# Patient Record
Sex: Female | Born: 2005 | Race: Black or African American | Hispanic: No | Marital: Single | Smoking: Never smoker
Health system: Southern US, Community
[De-identification: ages and names within clinical notes are randomized; demographics above are authoritative.]

## PROBLEM LIST (undated history)

## (undated) ENCOUNTER — Inpatient Hospital Stay (HOSPITAL_COMMUNITY): Payer: Self-pay

## (undated) DIAGNOSIS — Z789 Other specified health status: Secondary | ICD-10-CM

## (undated) HISTORY — DX: Other specified health status: Z78.9

## (undated) HISTORY — PX: NO PAST SURGERIES: SHX2092

---
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
## 2024-01-11 ENCOUNTER — Encounter: Admitting: Obstetrics and Gynecology

## 2024-01-18 ENCOUNTER — Telehealth (HOSPITAL_COMMUNITY): Payer: Self-pay | Admitting: *Deleted

## 2024-01-18 ENCOUNTER — Ambulatory Visit: Attending: Obstetrics and Gynecology

## 2024-01-18 ENCOUNTER — Other Ambulatory Visit: Payer: Self-pay | Admitting: *Deleted

## 2024-01-18 ENCOUNTER — Encounter: Payer: Self-pay | Admitting: Obstetrics and Gynecology

## 2024-01-18 ENCOUNTER — Other Ambulatory Visit: Payer: Self-pay | Admitting: Obstetrics and Gynecology

## 2024-01-18 ENCOUNTER — Other Ambulatory Visit (HOSPITAL_COMMUNITY)
Admission: RE | Admit: 2024-01-18 | Discharge: 2024-01-18 | Disposition: A | Discharge status: Home / Self Care | Source: Ambulatory Visit | Attending: Obstetrics and Gynecology | Admitting: Obstetrics and Gynecology

## 2024-01-18 ENCOUNTER — Ambulatory Visit (HOSPITAL_BASED_OUTPATIENT_CLINIC_OR_DEPARTMENT_OTHER)

## 2024-01-18 ENCOUNTER — Ambulatory Visit (HOSPITAL_BASED_OUTPATIENT_CLINIC_OR_DEPARTMENT_OTHER): Admitting: Maternal & Fetal Medicine

## 2024-01-18 ENCOUNTER — Encounter (HOSPITAL_COMMUNITY): Payer: Self-pay | Admitting: *Deleted

## 2024-01-18 ENCOUNTER — Ambulatory Visit

## 2024-01-18 VITALS — BP 114/62 | HR 95

## 2024-01-18 VITALS — BP 97/61 | HR 98 | Wt 144.3 lb

## 2024-01-18 DIAGNOSIS — Z3687 Encounter for antenatal screening for uncertain dates: Secondary | ICD-10-CM | POA: Insufficient documentation

## 2024-01-18 DIAGNOSIS — O99012 Anemia complicating pregnancy, second trimester: Secondary | ICD-10-CM | POA: Diagnosis not present

## 2024-01-18 DIAGNOSIS — D563 Thalassemia minor: Secondary | ICD-10-CM

## 2024-01-18 DIAGNOSIS — A749 Chlamydial infection, unspecified: Secondary | ICD-10-CM | POA: Insufficient documentation

## 2024-01-18 DIAGNOSIS — Z3A23 23 weeks gestation of pregnancy: Secondary | ICD-10-CM | POA: Diagnosis not present

## 2024-01-18 DIAGNOSIS — Z3686 Encounter for antenatal screening for cervical length: Secondary | ICD-10-CM

## 2024-01-18 DIAGNOSIS — O0932 Supervision of pregnancy with insufficient antenatal care, second trimester: Secondary | ICD-10-CM | POA: Insufficient documentation

## 2024-01-18 DIAGNOSIS — O98312 Other infections with a predominantly sexual mode of transmission complicating pregnancy, second trimester: Secondary | ICD-10-CM | POA: Diagnosis not present

## 2024-01-18 DIAGNOSIS — Z363 Encounter for antenatal screening for malformations: Secondary | ICD-10-CM | POA: Insufficient documentation

## 2024-01-18 DIAGNOSIS — O3432 Maternal care for cervical incompetence, second trimester: Secondary | ICD-10-CM | POA: Insufficient documentation

## 2024-01-18 DIAGNOSIS — Z113 Encounter for screening for infections with a predominantly sexual mode of transmission: Secondary | ICD-10-CM | POA: Diagnosis not present

## 2024-01-18 DIAGNOSIS — Z148 Genetic carrier of other disease: Secondary | ICD-10-CM

## 2024-01-18 DIAGNOSIS — O98812 Other maternal infectious and parasitic diseases complicating pregnancy, second trimester: Secondary | ICD-10-CM | POA: Insufficient documentation

## 2024-01-18 DIAGNOSIS — Z3480 Encounter for supervision of other normal pregnancy, unspecified trimester: Secondary | ICD-10-CM

## 2024-01-18 DIAGNOSIS — O26872 Cervical shortening, second trimester: Secondary | ICD-10-CM | POA: Diagnosis not present

## 2024-01-18 DIAGNOSIS — Z3482 Encounter for supervision of other normal pregnancy, second trimester: Secondary | ICD-10-CM

## 2024-01-18 DIAGNOSIS — O3680X0 Pregnancy with inconclusive fetal viability, not applicable or unspecified: Secondary | ICD-10-CM

## 2024-01-18 NOTE — Progress Notes (Signed)
..  SUBJECTIVE:  18 y.o. female is in the office for [facility] for +CT. Denies abnormal vaginal bleeding or significant pelvic pain or fever. No UTI symptoms. Denies history of known exposure to STD.  Patient's last menstrual period was 09/02/2023 (approximate).  OBJECTIVE:  She appears well, afebrile. Urine dipstick: not done.  ASSESSMENT:  TOC   PLAN:  GC, chlamydia, trichomonas, BVAG, CVAG probe sent to lab. Treatment: Orders placed by provider. To be determined once lab results are received ROV prn if symptoms persist or worsen.

## 2024-01-18 NOTE — Progress Notes (Signed)
 Patient information  Patient Name: Brenda Hanson  Patient MRN:   [id number]  Referring practice: [facility]  Problem List   Patient Active Problem List   Diagnosis Date Noted   Cervical insufficiency during pregnancy in second trimester, antepartum 01/18/2024   Late prenatal care affecting pregnancy in second trimester 01/18/2024   Thalassemia alpha carrier 12/19/2023   Chlamydia infection affecting pregnancy in second trimester, antepartum 12/06/2023   Supervision of other normal pregnancy, antepartum 12/05/2023    Maternal Fetal Medicine Consult [facility] [name] is a 18 y.o. G1P0000 at [redacted]w[redacted]d here for ultrasound and consultation. She had Low risk aneuploidy screening of a female fetus.    Today we focused on the following:   Short cervix: During the ultrasound today the cervix appeared short transabdominally.  The cervix was assessed transvaginally and is between 0.8 and 1 cm.  I discussed my concern for cervical insufficiency.  The patient reports that she does not have increased pelvic pressure, vaginal bleeding or loss of fluid.  We discussed the risks and benefits of vaginal progesterone versus cerclage.  I discussed that typically in the first pregnancy a cerclage is not recommended until the cervix is less than 1 cm or dilated.  Based on her clinical presentation I discussed that this is likely cervical insufficiency given her lack of symptoms.  Therefore I offered her cerclage.  After our counseling session she elected to have a cerclage placed.  She does have a history of chlamydial infection and will be tested in the clinic with appropriate treatment as needed.  We specifically discussed the risks of the cerclage would include premature rupture of membranes leading to a miscarriage or early birth with subsequent complications of prematurity, bleeding, infection or damage to nerves blood vessels and structures in the pelvis.  The patient will keep her phone on her and we will notify of her  exact time for her procedure.  I informed her to be 2 hours early to this time and to have nothing to eat or drink 8 hours prior to the procedure.  Alpha thalassemia carrier: I discussed the possibility of a fetal transmission based on the maternal carrier status genotype.  I also discussed the importance of having the father the baby tested.  Genetic counseling will be arranged for after today's visit.  Sonographic findings Single intrauterine pregnancy at [redacted]w[redacted]d. Fetal cardiac activity:  Observed and appears normal. Presentation: Cephalic. The anatomic structures that were well seen appear normal without evidence of soft markers. The anatomic survey is complete.  Fetal biometry shows the estimated fetal weight at the 65 percentile. Amniotic fluid: Within normal limits.  MVP: 5.08 cm. Placenta: Anterior. Adnexa: No abnormality visualized. Cervical length: 0.8cm.  There are limitations of prenatal ultrasound such as the inability to detect certain abnormalities due to poor visualization. Various factors such as fetal position, gestational age and maternal body habitus may increase the difficulty in visualizing the fetal anatomy.    Recommendations - EDD should be 05/13/2024 based on today's ultrasound -the patient's previous ultrasound was based off of femur length only and not complete biometry - Cerclage procedure to be done in the next 1 to 2 days based on OR availability - Genetic counseling after today's visit - Repeat testing for gonorrhea and chlamydia to be done in the clinic with appropriate antibiotic treatment as necessary - Follow-up ultrasound in 7 to 10 days after cerclage placement  Review of Systems: A review of systems was performed and was negative except per HPI  Past Obstetrical History:  OB History  Gravida Para Term Preterm AB Living  1 0 0 0 0 0  SAB IAB Ectopic Multiple Live Births  0 0 0 0 0    # Outcome Date GA Lbr Len/2nd Weight Sex Type Anes PTL Lv  1  Current              Past Medical History:  Past Medical History:  Diagnosis Date   Medical history non-contributory      Past Surgical History:    Past Surgical History:  Procedure Laterality Date   NO PAST SURGERIES       Home Medications:   Current Outpatient Medications on File Prior to Visit  Medication Sig Dispense Refill   aspirin  EC 81 MG tablet Take 1 tablet (81 mg total) by mouth at bedtime. Start taking when you are [redacted] weeks pregnant for rest of pregnancy for prevention of preeclampsia 300 tablet 2   Prenatal Vit-Fe Phos-FA-Omega (VITAFOL  GUMMIES) 3.33-0.333-34.8 MG CHEW Chew 1 tablet by mouth daily. 90 tablet 5   Blood Pressure Monitoring (BLOOD PRESSURE KIT) DEVI 1 Device by Does not apply route once a week. 1 each 0   fluticasone (FLONASE) 50 MCG/ACT nasal spray Place 1 spray into both nostrils daily.     loperamide  (IMODIUM ) 2 MG capsule Take 1 capsule (2 mg total) by mouth 2 (two) times daily as needed for diarrhea or loose stools. 14 capsule 0   loratadine (CLARITIN) 10 MG tablet Take 10 mg by mouth daily.     metroNIDAZOLE  (FLAGYL ) 500 MG tablet Take 1 tablet (500 mg total) by mouth 2 (two) times daily. 14 tablet 0   ondansetron  (ZOFRAN -ODT) 8 MG disintegrating tablet Take 1 tablet (8 mg total) by mouth every 8 (eight) hours as needed for nausea or vomiting. 20 tablet 0   No current facility-administered medications on file prior to visit.      Allergies:   Allergies  Allergen Reactions   Pineapple      Physical Exam:   Vitals:   01/18/24 0940  BP: 114/62  Pulse: 95   Sitting comfortably on the sonogram table Nonlabored breathing Normal rate and rhythm Abdomen is nontender  Thank you for the opportunity to be involved with this patient's care. Please let us  know if we can be of any further assistance.   45 minutes of time was spent reviewing the patient's chart including labs, imaging and documentation.  At least 50% of this time was spent with  direct patient care discussing the diagnosis, management and prognosis of her care.  Delora Smaller MFM, [facility]   01/18/2024  1:07 PM

## 2024-01-18 NOTE — Telephone Encounter (Signed)
 Preadmission screen

## 2024-01-18 NOTE — Progress Notes (Signed)
 [facility] for Maternal Fetal Care at [facility] [address] Phone:  [phone]   Fax:  [phone]      In-Person Genetic Counseling Clinic Note:   I spoke with 18 y.o. Brenda Hanson today to discuss her carrier screening results. She was referred by Brenda Gong, MD. She was accompanied by Brenda Hanson Brenda Hanson.   Pregnancy History:    G1P0. EGA: [redacted]w[redacted]d by US . EDD: 05/14/2024. Denies major personal health concerns. Denies bleeding, infections, and fevers in this pregnancy. Denies using tobacco, alcohol, or street drugs in this pregnancy.   Family History:    A three-generation pedigree was created and scanned into Epic under the Media tab.  Maternal ethnicity reported as Black and paternal ethnicity reported as Black. Denies Ashkenazi Jewish ancestry.  Family history not remarkable for consanguinity, individuals with birth defects, intellectual disability, autism spectrum disorder, multiple spontaneous abortions, still births, or unexplained neonatal death.   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 Carriers may have mild anemia. She screened negative for the other three conditions (CF, SMA, and beta-hemoglobinopathies). She screened negative for the other three conditions (CF, SMA, and beta-hemoglobinopathies). Negative carrier screening reduces but does not eliminate the chance of being a carrier. Please see report for details.  We reviewed the genetics of alpha thalassemia, autosomal recessive mode of inheritance, and clinical features of these conditions. We reviewed that Brenda Hanson will pass down one functional copy of the alpha globin gene and one deletion (-?) in each pregnancy. Therefore, this pregnancy is not at increased risk for hemoglobin Bart's due to four deletions of the alpha globin genes (--/--)  regardless of her reproductive partner's carrier status. The pregnancy may be at increased risk for hemoglobin H disease (50%) if her partner is an alpha thalassemia carrier in the cis configuration (--/??). We reviewed that this is more common in Brenda Hanson and less common in those with Black ancestry.  Given these results, we discussed and offered carrier screening for Brenda Hanson's reproductive partner. We reviewed the benefits and limitations of carrier screening and that it can detect most but not all carriers.  The couple consented carrier screening for Brenda Hanson. His blood was drawn during today's visit. He consented that we call Brenda Hanson with the results. We reviewed that if both were found to be carriers, prenatal diagnosis through amniocentesis would be available. We reviewed the technical aspects, benefits, risks, and limitations of amniocentesis including the 1 in 500 risk for miscarriage. We also discussed that testing can be completed postnatally. Of note, the [place]  Newborn Screening (NBS) Program screens all newborn babies for cystic fibrosis, spinal muscular atrophy, hemoglobinopathies, and numerous other conditions. It does not screen for alpha thalassemia.  Newborn Screening. The [place]  Newborn Screening (NBS) program will screen all newborn babies for cystic fibrosis, spinal muscular atrophy, hemoglobinopathies, and numerous other conditions.  Previous Testing Completed:  Low risk NIPS: Brenda Hanson previously completed noninvasive prenatal screening (NIPS) in this pregnancy. The result is low risk, consistent with a female fetus. This screening significantly reduces but does not eliminate the chance that the current pregnancy has Down syndrome (trisomy 16), trisomy 58, trisomy 6, common sex chromosome conditions, and 22q11.2 microdeletion syndrome. Please see report for details. There are many genetic conditions that cannot be detected by NIPS.    Plan of Care:  Brenda Hanson  carrier screening for alpha thalassemia was drawn. We will call the couple with the results. Routine prenatal care.   Informed consent was obtained. All questions were answered.   25 minutes were spent on the date of the encounter in service to the patient including preparation, face-to-face consultation, discussion of test reports and available next steps, pedigree construction, genetic risk assessment, documentation, and care coordination.    Thank you for sharing in the care of Brenda Hanson with us .  Please do not hesitate to contact us  at [phone] if you have any questions.   Lauraine Bodily, MS, [facility] Certified Genetic Counselor   Genetic counseling student involved in appointment: No.

## 2024-01-18 NOTE — Progress Notes (Signed)
 OB Note Dr. William requests vaginal swab test of cure prior to potential cerclage on Friday. Patient called and okay with coming to [facility] office this afternoon for swab to hopefully have results back tomorrow  [facility] office aware and to coordinate.  Bebe Izell Raddle MD Attending Center for [employer] (Faculty Practice) 01/18/2024 Time: 1300

## 2024-01-19 ENCOUNTER — Encounter: Admitting: Certified Nurse Midwife

## 2024-01-19 ENCOUNTER — Ambulatory Visit: Admitting: Certified Nurse Midwife

## 2024-01-19 VITALS — BP 108/76 | HR 87 | Wt 145.4 lb

## 2024-01-19 DIAGNOSIS — Z3A23 23 weeks gestation of pregnancy: Secondary | ICD-10-CM | POA: Diagnosis not present

## 2024-01-19 DIAGNOSIS — O0992 Supervision of high risk pregnancy, unspecified, second trimester: Secondary | ICD-10-CM

## 2024-01-19 DIAGNOSIS — Z3492 Encounter for supervision of normal pregnancy, unspecified, second trimester: Secondary | ICD-10-CM

## 2024-01-19 DIAGNOSIS — Z8619 Personal history of other infectious and parasitic diseases: Secondary | ICD-10-CM | POA: Diagnosis not present

## 2024-01-19 DIAGNOSIS — N883 Incompetence of cervix uteri: Secondary | ICD-10-CM

## 2024-01-19 LAB — CERVICOVAGINAL ANCILLARY ONLY
Bacterial Vaginitis (gardnerella): POSITIVE — AB
Candida Glabrata: NEGATIVE
Candida Vaginitis: NEGATIVE
Chlamydia: POSITIVE — AB
Comment: NEGATIVE
Comment: NEGATIVE
Comment: NEGATIVE
Comment: NEGATIVE
Comment: NEGATIVE
Comment: NORMAL
Neisseria Gonorrhea: NEGATIVE
Trichomonas: NEGATIVE

## 2024-01-19 NOTE — Progress Notes (Signed)
 Pt presents for rob.pt had TOC on 8/2. Pt has no questions or concerns at this time.

## 2024-01-19 NOTE — Progress Notes (Signed)
   PRENATAL VISIT NOTE  Subjective:  Brenda Hanson is a 18 y.o. G1P0000 at [redacted]w[redacted]d being seen today for ongoing prenatal care.  She is currently monitored for the following issues for this low-risk pregnancy and has Encounter for supervision of other normal pregnancy, second trimester; Chlamydia infection affecting pregnancy in second trimester, antepartum; Thalassemia alpha carrier; Cervical insufficiency during pregnancy in second trimester, antepartum; and Late prenatal care affecting pregnancy in second trimester on their problem list.  Patient reports no complaints.  Contractions: Not present. Vag. Bleeding: None.  Movement: Present. Denies leaking of fluid.   The following portions of the patient's history were reviewed and updated as appropriate: allergies, current medications, past family history, past medical history, past social history, past surgical history and problem list.   Objective:    Vitals:   01/19/24 0946  BP: 108/76  Pulse: 87  Weight: 145 lb 6.4 oz (66 kg)    Fetal Status:  Fetal Heart Rate (bpm): 143 Fundal Height: 23 cm Movement: Present    General: Alert, oriented and cooperative. Patient is in no acute distress.  Skin: Skin is warm and dry. No rash noted.   Cardiovascular: Normal heart rate noted  Respiratory: Normal respiratory effort, no problems with respiration noted  Abdomen: Soft, gravid, appropriate for gestational age.  Pain/Pressure: Absent     Pelvic: Cervical exam deferred        Extremities: Normal range of motion.  Edema: None  Mental Status: Normal mood and affect. Normal behavior. Normal judgment and thought content.   Assessment and Plan:  Pregnancy: G1P0000 at [redacted]w[redacted]d 1. Supervision of high risk pregnancy in second trimester - Patient doing well.  - Reports frequent and vigorous fetal movement   2. [redacted] weeks gestation of pregnancy - Fundal height appropriate for gestational age   16. Short cervix - Patient has a cerclage scheduled for  tomorrow.   4. History of chlamydia (Primary) - Patient presented yesterday for a vaginal swab for Chlamydia.  - Results still pending.   Preterm labor symptoms and general obstetric precautions including but not limited to vaginal bleeding, contractions, leaking of fluid and fetal movement were reviewed in detail with the patient. Please refer to After Visit Summary for other counseling recommendations.   Return in about 4 weeks (around 02/16/2024) for HROB w GTT.  Future Appointments  Date Time Provider Department Center  02/02/2024  1:15 PM [facility] PROVIDER 1 [facility]  02/02/2024  1:30 PM [facility] US4 [facility]  02/16/2024  8:35 AM [facility] LAB [facility] None  02/16/2024  9:35 AM Delores Nidia CROME, FNP [facility] None    Niklas Chretien Erven) Emilio, MSN, CNM  Center for [facility]  01/19/2024 10:47 AM

## 2024-01-20 ENCOUNTER — Encounter (HOSPITAL_COMMUNITY)
Admission: RE | Disposition: A | Discharge status: Home / Self Care | Payer: Self-pay | Source: Home / Self Care | Attending: Maternal & Fetal Medicine

## 2024-01-20 ENCOUNTER — Ambulatory Visit (HOSPITAL_COMMUNITY)
Admission: RE | Admit: 2024-01-20 | Discharge: 2024-01-20 | Disposition: A | Discharge status: Home / Self Care | Attending: Maternal & Fetal Medicine | Admitting: Maternal & Fetal Medicine

## 2024-01-20 ENCOUNTER — Other Ambulatory Visit: Payer: Self-pay

## 2024-01-20 ENCOUNTER — Encounter (HOSPITAL_COMMUNITY): Payer: Self-pay | Admitting: Maternal & Fetal Medicine

## 2024-01-20 ENCOUNTER — Inpatient Hospital Stay (HOSPITAL_COMMUNITY): Admitting: Anesthesiology

## 2024-01-20 ENCOUNTER — Inpatient Hospital Stay (HOSPITAL_BASED_OUTPATIENT_CLINIC_OR_DEPARTMENT_OTHER): Admitting: Anesthesiology

## 2024-01-20 DIAGNOSIS — O3432 Maternal care for cervical incompetence, second trimester: Secondary | ICD-10-CM

## 2024-01-20 DIAGNOSIS — Z3A23 23 weeks gestation of pregnancy: Secondary | ICD-10-CM | POA: Diagnosis present

## 2024-01-20 DIAGNOSIS — D563 Thalassemia minor: Secondary | ICD-10-CM | POA: Insufficient documentation

## 2024-01-20 DIAGNOSIS — A749 Chlamydial infection, unspecified: Secondary | ICD-10-CM

## 2024-01-20 DIAGNOSIS — Z87891 Personal history of nicotine dependence: Secondary | ICD-10-CM | POA: Diagnosis not present

## 2024-01-20 DIAGNOSIS — F419 Anxiety disorder, unspecified: Secondary | ICD-10-CM | POA: Diagnosis not present

## 2024-01-20 DIAGNOSIS — Z3A00 Weeks of gestation of pregnancy not specified: Secondary | ICD-10-CM | POA: Diagnosis not present

## 2024-01-20 DIAGNOSIS — Z3482 Encounter for supervision of other normal pregnancy, second trimester: Secondary | ICD-10-CM

## 2024-01-20 HISTORY — PX: CERVICAL CERCLAGE: SHX1329

## 2024-01-20 LAB — CBC
HCT: 32.7 % — ABNORMAL LOW (ref 36.0–46.0)
Hemoglobin: 10.3 g/dL — ABNORMAL LOW (ref 12.0–15.0)
MCH: 23.9 pg — ABNORMAL LOW (ref 26.0–34.0)
MCHC: 31.5 g/dL (ref 30.0–36.0)
MCV: 75.9 fL — ABNORMAL LOW (ref 80.0–100.0)
Platelets: 274 K/uL (ref 150–400)
RBC: 4.31 MIL/uL (ref 3.87–5.11)
RDW: 14.2 % (ref 11.5–15.5)
WBC: 9.4 K/uL (ref 4.0–10.5)
nRBC: 0 % (ref 0.0–0.2)

## 2024-01-20 LAB — TYPE AND SCREEN
ABO/RH(D): A POS
Antibody Screen: NEGATIVE

## 2024-01-20 LAB — ABO/RH: ABO/RH(D): A POS

## 2024-01-20 SURGERY — CERCLAGE, CERVIX, VAGINAL APPROACH
Anesthesia: Spinal

## 2024-01-20 MED ORDER — AZITHROMYCIN 250 MG PO TABS
ORAL_TABLET | ORAL | Status: AC
  Filled 2024-01-20: qty 2

## 2024-01-20 MED ORDER — AZITHROMYCIN 250 MG PO TABS
1000.0000 mg | ORAL_TABLET | Freq: Once | ORAL | Status: AC
  Administered 2024-01-20: 1000 mg via ORAL

## 2024-01-20 MED ORDER — HYDROMORPHONE HCL 1 MG/ML IJ SOLN: 0.2500 mg | INTRAMUSCULAR | Status: DC | PRN

## 2024-01-20 MED ORDER — OXYCODONE HCL 5 MG PO TABS: 5.0000 mg | ORAL_TABLET | Freq: Once | ORAL | Status: DC | PRN

## 2024-01-20 MED ORDER — OXYCODONE HCL 5 MG/5ML PO SOLN: 5.0000 mg | Freq: Once | ORAL | Status: DC | PRN

## 2024-01-20 MED ORDER — KETOROLAC TROMETHAMINE 30 MG/ML IJ SOLN: 30.0000 mg | Freq: Once | INTRAMUSCULAR | Status: DC | PRN

## 2024-01-20 MED ORDER — STERILE WATER FOR IRRIGATION IR SOLN
Status: DC | PRN
  Administered 2024-01-20: 1000 mL

## 2024-01-20 MED ORDER — LACTATED RINGERS IV SOLN: INTRAVENOUS | Status: DC

## 2024-01-20 MED ORDER — PHENYLEPHRINE 80 MCG/ML (10ML) SYRINGE FOR IV PUSH (FOR BLOOD PRESSURE SUPPORT)
PREFILLED_SYRINGE | INTRAVENOUS | Status: AC
Start: 2024-01-20 — End: 2024-01-20
  Filled 2024-01-20: qty 10

## 2024-01-20 MED ORDER — ONDANSETRON HCL 4 MG/2ML IJ SOLN
INTRAMUSCULAR | Status: DC | PRN
Start: 2024-01-20 — End: 2024-02-03
  Administered 2024-01-20: 4 mg via INTRAVENOUS

## 2024-01-20 MED ORDER — ONDANSETRON HCL 4 MG/2ML IJ SOLN: 4.0000 mg | Freq: Once | INTRAMUSCULAR | Status: DC | PRN

## 2024-01-20 MED ORDER — PHENYLEPHRINE 80 MCG/ML (10ML) SYRINGE FOR IV PUSH (FOR BLOOD PRESSURE SUPPORT)
PREFILLED_SYRINGE | INTRAVENOUS | Status: DC | PRN
  Administered 2024-01-20 (×2): 80 ug via INTRAVENOUS

## 2024-01-20 MED ORDER — BUPIVACAINE IN DEXTROSE 0.75-8.25 % IT SOLN
INTRATHECAL | Status: DC | PRN
Start: 2024-01-20 — End: 2024-02-03
  Administered 2024-01-20: 7.5 mg via INTRATHECAL

## 2024-01-20 MED ORDER — AZITHROMYCIN 250 MG PO TABS
ORAL_TABLET | ORAL | Status: AC
Start: 2024-01-20 — End: 2024-01-20
  Filled 2024-01-20: qty 2

## 2024-01-20 SURGICAL SUPPLY — 17 items
CANISTER SUCT 3000ML PPV (MISCELLANEOUS) ×1 IMPLANT
ELECTRODE REM PT RTRN 9FT ADLT (ELECTROSURGICAL) ×1 IMPLANT
GLOVE BIO SURGEON STRL SZ7 (GLOVE) ×1 IMPLANT
GLOVE BIO SURGEON STRL SZ7.5 (GLOVE) ×1 IMPLANT
GOWN STRL REUS W/ TWL LRG LVL3 (GOWN DISPOSABLE) ×1 IMPLANT
HIBICLENS CHG 4% 4OZ BTL (MISCELLANEOUS) ×1 IMPLANT
PACK VAGINAL MINOR WOMEN LF (CUSTOM PROCEDURE TRAY) ×1 IMPLANT
PAD OB MATERNITY 4.3X12.25 (PERSONAL CARE ITEMS) ×1 IMPLANT
PAD PREP 24X48 CUFFED NSTRL (MISCELLANEOUS) ×1 IMPLANT
PENCIL BUTTON HOLSTER BLD 10FT (ELECTRODE) ×1 IMPLANT
PENCIL SMOKE EVAC W/HOLSTER (ELECTROSURGICAL) IMPLANT
SET BERKELEY SUCTION TUBING (SUCTIONS) IMPLANT
SUT MERSILENE FIBER S 5 MO-4 1 (SUTURE) ×1 IMPLANT
TOWEL OR 17X24 6PK STRL BLUE (TOWEL DISPOSABLE) ×1 IMPLANT
TRAY FOLEY W/BAG SLVR 14FR (SET/KITS/TRAYS/PACK) ×1 IMPLANT
TUBING NON-CON 1/4 X 20 CONN (TUBING) ×1 IMPLANT
YANKAUER SUCT BULB TIP NO VENT (SUCTIONS) ×1 IMPLANT

## 2024-01-20 NOTE — Progress Notes (Signed)
 NST appropriate for gestational age. Second reviewer A White RN.

## 2024-01-20 NOTE — Op Note (Signed)
 Procedure(s): CERCLAGE, CERVIX, VAGINAL APPROACH Procedure Note  Brenda Hanson female 18 y.o. 01/20/2024  Procedure(s) and Anesthesia Type:    * CERCLAGE, CERVIX, VAGINAL APPROACH - Choice  Surgeons and Role:    * William Glenn, DO - Primary    * Erik Kieth BROCKS, MD - Assisting   Indications: The patient was admitted to the hospital for a very short cervix at 8 mm.      Surgeon: Glenn William   Assistants: Buzz Cristal, MD  Anesthesia: Spinal anesthesia  Procedure Detail  After obtaining informed consent the patient was taken to the operative room and spinal anesthesia was established.  The patient was prepped and draped in the appropriate sterile fashion for a vaginal procedure.  She was placed in the lithotomy position with legs supported by stirrups.  A timeout was performed to confirm the patient's name, date of birth and the procedure.   A weighted speculum was placed into the vagina in addition to a right-angle retractor to visualize the cervix.  The cervix appeared closed but very short with the membranes approximately halfway down the cervical canal.  The cervix was grasped with 2 ring forceps and caudad traction was placed on the cervix.  The Bovie was used to dissect the portiovaginalis at the cervicovaginal junction and at the rectovaginals.  Blunt dissection was used to push the bladder cephalad.  Mersilene suture was used in a pursestring fashion at the level of the internal os.  The suture was then tied down and the strings were cut and left long for visualization.  The cervix was checked and was noted to be closed.  Pressure was applied over the portion of the cervix that was incised.  Hemostasis was noted.  A rectal exam confirmed there was no suture present or defect.  The patient tolerated the procedure well and was sent to PACU in satisfactory condition.   All needles, sponges and devices were counted and noted to be correct x2.   CERCLAGE, CERVIX, VAGINAL  APPROACH  Findings: Very short appearing cervix.   Estimated Blood Loss:  Minimal         Total IV Fluids: See anesthesia report   Blood Given: none          Specimens: None        Complications:  None         Disposition: PACU - hemodynamically stable.         Condition: stable

## 2024-01-20 NOTE — Progress Notes (Signed)
 D/c home in stable condition. Pt ambulating, no pain. Discharge instructions given and verbal understanding obtained.

## 2024-01-20 NOTE — Anesthesia Preprocedure Evaluation (Addendum)
 Anesthesia Evaluation  Patient identified by MRN, date of birth, ID band Patient awake    Reviewed: Allergy & Precautions, NPO status , Patient's Chart, lab work & pertinent test results  Airway Mallampati: II  TM Distance: >3 FB Neck ROM: Full    Dental no notable dental hx. (+) Teeth Intact, Dental Advisory Given   Pulmonary neg pulmonary ROS   Pulmonary exam normal breath sounds clear to auscultation       Cardiovascular Normal cardiovascular exam Rhythm:Regular Rate:Normal     Neuro/Psych   Anxiety        GI/Hepatic   Endo/Other    Renal/GU      Musculoskeletal   Abdominal   Peds  Hematology   Anesthesia Other Findings   Reproductive/Obstetrics (+) Pregnancy                              Anesthesia Physical Anesthesia Plan  ASA: 2  Anesthesia Plan: Spinal   Post-op Pain Management: Regional block*   Induction:   PONV Risk Score and Plan: Treatment may vary due to age or medical condition  Airway Management Planned: Natural Airway and Nasal Cannula  Additional Equipment: None  Intra-op Plan:   Post-operative Plan:   Informed Consent:   Plan Discussed with: CRNA and Surgeon  Anesthesia Plan Comments: (23.4 wk primagravida  for cerclage under spinal)         Anesthesia Quick Evaluation

## 2024-01-20 NOTE — H&P (Signed)
 OBSTETRIC ADMISSION HISTORY AND PHYSICAL  Brenda Hanson is a 18 y.o. female G1P0000 with IUP at [redacted]w[redacted]d by 23wk US  presenting for short cervix/suspected cervical insufficiency.   Seen by MFM 8/27 - CL 8-53mm. Counseled on vaginal progesterone versus cerclage and she opted for cerclage.   Today, she has no complaints. She denies pain, bleeding, or LOF. +FM.    Prenatal History/Complications:  Suspected cervical insufficiency - CL <2mm at anatomy on 8/27. Planned for cerclage as above Chlamydia dx 8/27 - azithro ordered to be given post op Alpha thal silent carrier Anx/bipolar depression no meds  Past Medical History: Past Medical History:  Diagnosis Date   Medical history non-contributory     Past Surgical History: Past Surgical History:  Procedure Laterality Date   NO PAST SURGERIES      Obstetrical History: OB History     Gravida  1   Para  0   Term  0   Preterm  0   AB  0   Living  0      SAB  0   IAB  0   Ectopic  0   Multiple  0   Live Births  0           Social History Social History   Socioeconomic History   Marital status: Single    Spouse name: Not on file   Number of children: Not on file   Years of education: Not on file   Highest education level: Not on file  Occupational History   Not on file  Tobacco Use   Smoking status: Never   Smokeless tobacco: Never  Vaping Use   Vaping status: Former   Substances: Nicotine, Flavoring  Substance and Sexual Activity   Alcohol use: Never   Drug use: Never   Sexual activity: Yes  Other Topics Concern   Not on file  Social History Narrative   Not on file   Social Drivers of Health   Financial Resource Strain: Not on File (09/10/2021)   Received from [employer]    Financial Resource Strain: 0  Food Insecurity: No Food Insecurity (01/20/2024)   Hunger Vital Sign    Worried About Running Out of Food in the Last Year: Never true    Ran Out of Food in the Last  Year: Never true  Transportation Needs: No Transportation Needs (01/20/2024)   PRAPARE - Administrator, Civil Service (Medical): No    Lack of Transportation (Non-Medical): No  Physical Activity: Not on File (09/10/2021)   Received from [facility]   Physical Activity    Physical Activity: 0  Stress: Not on File (09/10/2021)   Received from [facility]   Stress    Stress: 0  Social Connections: Not on File (02/05/2023)   Received from [facility]   Social Connections    Connectedness: 0    Family History: Family History  Problem Relation Age of Onset   Heart disease Mother        fluid around heart   Healthy Father     Allergies: Allergies  Allergen Reactions   Pineapple Other (See Comments)    Tongue burning    Medications Prior to Admission  Medication Sig Dispense Refill Last Dose/Taking   aspirin  EC 81 MG tablet Take 1 tablet (81 mg total) by mouth at bedtime. Start taking when you are [redacted] weeks pregnant for rest of pregnancy for prevention of preeclampsia 300 tablet 2 Taking  fluticasone (FLONASE) 50 MCG/ACT nasal spray Place 1 spray into both nostrils daily as needed for allergies.   Taking As Needed   loratadine (CLARITIN) 10 MG tablet Take 10 mg by mouth daily as needed for allergies.   Taking As Needed   ondansetron  (ZOFRAN -ODT) 8 MG disintegrating tablet Take 1 tablet (8 mg total) by mouth every 8 (eight) hours as needed for nausea or vomiting. 20 tablet 0 Taking As Needed   Prenatal Vit-Fe Phos-FA-Omega (VITAFOL  GUMMIES) 3.33-0.333-34.8 MG CHEW Chew 1 tablet by mouth daily. 90 tablet 5 Taking   Blood Pressure Monitoring (BLOOD PRESSURE KIT) DEVI 1 Device by Does not apply route once a week. 1 each 0     Review of Systems   All systems reviewed and negative except as stated in HPI  Blood pressure 112/72, pulse 89, temperature 98.3 F (36.8 C), temperature source Oral, resp. rate 18, height 5' 2.5 (1.588 m), weight 66.2 kg, last menstrual period 09/02/2023, SpO2  100%. General appearance: alert, cooperative, and no distress Lungs: normal effort Heart: regular ra Abdomen: soft, non-tender  Prenatal labs: ABO, Rh: A/Positive/-- (07/14 0955) Antibody: Negative (07/14 0955) Rubella: 1.62 (07/14 0955) RPR: Non Reactive (07/14 0955)  HBsAg: Negative (07/14 0955)  HIV: Non Reactive (07/14 0955)  GBS:     Patient Active Problem List   Diagnosis Date Noted   Cervical insufficiency during pregnancy in second trimester, antepartum 01/18/2024   Late prenatal care affecting pregnancy in second trimester 01/18/2024   Thalassemia alpha carrier 12/19/2023   Chlamydia infection affecting pregnancy in second trimester, antepartum 12/06/2023   Encounter for supervision of other normal pregnancy, second trimester 12/05/2023    Assessment/Plan:  Brenda Hanson is a 18 y.o. G1P0000 at [redacted]w[redacted]d here for scheduled transvaginal cerclage  Patient counseled on risks/benefits/alternatives by Dr. William and consents will be signed.  To OR when ready.   Kieth JAYSON Carolin, MD  01/20/2024, 10:55 AM

## 2024-01-22 ENCOUNTER — Encounter (HOSPITAL_COMMUNITY): Payer: Self-pay | Admitting: Maternal & Fetal Medicine

## 2024-01-27 ENCOUNTER — Ambulatory Visit: Payer: Self-pay | Admitting: Obstetrics and Gynecology

## 2024-01-27 MED ORDER — METRONIDAZOLE 500 MG PO TABS: 500.0000 mg | ORAL_TABLET | Freq: Two times a day (BID) | ORAL | 0 refills | Status: DC

## 2024-01-27 MED ORDER — AZITHROMYCIN 500 MG PO TABS
1000.0000 mg | ORAL_TABLET | Freq: Once | ORAL | 0 refills | Status: AC
Start: 2024-01-27 — End: 2024-01-27

## 2024-01-31 ENCOUNTER — Telehealth: Payer: Self-pay

## 2024-01-31 NOTE — Telephone Encounter (Signed)
 Attempted to call patient with FOB's negative carrier screening results. No answer and unable to leave message.

## 2024-01-31 NOTE — Telephone Encounter (Signed)
 Patient returned call. We discussed her partner's carrier screening results (Markeah Booker DOB 06/14/2005). He was not found to be a carrier for alpha thalassemia. Please see report for details. A negative result on carrier screening reduces but does not eliminate the chance of being a carrier. The chance this couple's current and future pregnancies would be affected with alpha thalassemia is very low.  Lauraine Bodily, MS, [facility] Certified Genetic Counselor [facility] Maternal Fetal [facility] [phone]

## 2024-02-02 ENCOUNTER — Ambulatory Visit (HOSPITAL_BASED_OUTPATIENT_CLINIC_OR_DEPARTMENT_OTHER)

## 2024-02-02 ENCOUNTER — Ambulatory Visit: Attending: Maternal & Fetal Medicine | Admitting: Obstetrics and Gynecology

## 2024-02-02 VITALS — BP 123/64 | HR 93

## 2024-02-02 DIAGNOSIS — A749 Chlamydial infection, unspecified: Secondary | ICD-10-CM

## 2024-02-02 DIAGNOSIS — Z148 Genetic carrier of other disease: Secondary | ICD-10-CM | POA: Diagnosis not present

## 2024-02-02 DIAGNOSIS — Z3A25 25 weeks gestation of pregnancy: Secondary | ICD-10-CM | POA: Insufficient documentation

## 2024-02-02 DIAGNOSIS — O0932 Supervision of pregnancy with insufficient antenatal care, second trimester: Secondary | ICD-10-CM | POA: Diagnosis not present

## 2024-02-02 DIAGNOSIS — O26872 Cervical shortening, second trimester: Secondary | ICD-10-CM | POA: Diagnosis not present

## 2024-02-02 DIAGNOSIS — O3432 Maternal care for cervical incompetence, second trimester: Secondary | ICD-10-CM

## 2024-02-02 DIAGNOSIS — D563 Thalassemia minor: Secondary | ICD-10-CM | POA: Diagnosis not present

## 2024-02-02 DIAGNOSIS — Z3686 Encounter for antenatal screening for cervical length: Secondary | ICD-10-CM | POA: Insufficient documentation

## 2024-02-02 DIAGNOSIS — Z3482 Encounter for supervision of other normal pregnancy, second trimester: Secondary | ICD-10-CM

## 2024-02-02 NOTE — Progress Notes (Signed)
 Maternal-Fetal Medicine Consultation  Name: Brenda Hanson  MRN: [id number]  GA: G1P0000 [redacted]w[redacted]d   Patient had rescue cerclage 2 weeks ago.  She does not have signs and symptoms of preterm labor.  No history of vaginal bleeding.  Ultrasound A limited ultrasound study was performed.  Amniotic fluid normal good fetal activity seen.  Cephalic presentation. We performed a transvaginal ultrasound to evaluate the cervix.  The cervix measured 1.7 cm (9 millimeters from the cerclage to the external os). I reassured the patient of the findings.  I explained that cerclage placement does not guarantee carrying pregnancy to term and that she is still at high risk for preterm labor/delivery.  Recommendations -Follow-up scans as clinically indicated.     Consultation including face-to-face (more than 50%) counseling 10 minutes.

## 2024-02-03 ENCOUNTER — Encounter (HOSPITAL_COMMUNITY): Payer: Self-pay | Admitting: Maternal & Fetal Medicine

## 2024-02-03 NOTE — Transfer of Care (Signed)
 Immediate Anesthesia Transfer of Care Note  Patient: Brenda Hanson  Procedure(s) Performed: CERCLAGE, CERVIX, VAGINAL APPROACH  Patient Location: PACU  Anesthesia Type:Spinal  Level of Consciousness: awake, alert , and oriented  Airway & Oxygen Therapy: Patient Spontanous Breathing  Post-op Assessment: Report given to RN and Post -op Vital signs reviewed and stable  Post vital signs: Reviewed and stable  Last Vitals:  Vitals Value Taken Time  BP 123/64 02/02/24 13:21  Temp 36.7 C 01/20/24 15:00  Pulse 93 02/02/24 13:21  Resp 17 01/20/24 14:45  SpO2 100 % 01/20/24 14:45    Last Pain:  Vitals:   01/20/24 1500  TempSrc: Oral  PainSc:          Complications: No notable events documented.

## 2024-02-04 NOTE — Anesthesia Postprocedure Evaluation (Signed)
 Anesthesia Post Note  Patient: Brenda Hanson  Procedure(s) Performed: CERCLAGE, CERVIX, VAGINAL APPROACH     Patient location during evaluation: PACU Anesthesia Type: Spinal Level of consciousness: oriented and awake and alert Pain management: pain level controlled Vital Signs Assessment: post-procedure vital signs reviewed and stable Respiratory status: spontaneous breathing and respiratory function stable Cardiovascular status: blood pressure returned to baseline and stable Postop Assessment: no headache, no backache, no apparent nausea or vomiting and able to ambulate Anesthetic complications: no   No notable events documented.  Last Vitals:  Vitals:   01/20/24 1500 01/20/24 1544  BP:  126/74  Pulse:    Resp:    Temp: 36.7 C   SpO2:      Last Pain:  Vitals:   01/20/24 1500  TempSrc: Oral  PainSc:                  Garnette DELENA Gab

## 2024-02-16 ENCOUNTER — Other Ambulatory Visit

## 2024-02-16 ENCOUNTER — Ambulatory Visit: Admitting: Obstetrics and Gynecology

## 2024-02-16 ENCOUNTER — Encounter: Payer: Self-pay | Admitting: Obstetrics and Gynecology

## 2024-02-16 ENCOUNTER — Other Ambulatory Visit (HOSPITAL_COMMUNITY)
Admission: RE | Admit: 2024-02-16 | Discharge: 2024-02-16 | Disposition: A | Discharge status: Home / Self Care | Source: Ambulatory Visit | Attending: Obstetrics and Gynecology | Admitting: Obstetrics and Gynecology

## 2024-02-16 VITALS — BP 108/69 | HR 102 | Wt 153.0 lb

## 2024-02-16 DIAGNOSIS — O3430 Maternal care for cervical incompetence, unspecified trimester: Secondary | ICD-10-CM

## 2024-02-16 DIAGNOSIS — A749 Chlamydial infection, unspecified: Secondary | ICD-10-CM | POA: Insufficient documentation

## 2024-02-16 DIAGNOSIS — O98819 Other maternal infectious and parasitic diseases complicating pregnancy, unspecified trimester: Secondary | ICD-10-CM

## 2024-02-16 DIAGNOSIS — Z3A27 27 weeks gestation of pregnancy: Secondary | ICD-10-CM | POA: Diagnosis not present

## 2024-02-16 DIAGNOSIS — N898 Other specified noninflammatory disorders of vagina: Secondary | ICD-10-CM | POA: Diagnosis present

## 2024-02-16 DIAGNOSIS — Z23 Encounter for immunization: Secondary | ICD-10-CM | POA: Diagnosis not present

## 2024-02-16 DIAGNOSIS — O3432 Maternal care for cervical incompetence, second trimester: Secondary | ICD-10-CM

## 2024-02-16 DIAGNOSIS — O0992 Supervision of high risk pregnancy, unspecified, second trimester: Secondary | ICD-10-CM

## 2024-02-16 DIAGNOSIS — O0993 Supervision of high risk pregnancy, unspecified, third trimester: Secondary | ICD-10-CM

## 2024-02-16 NOTE — Progress Notes (Addendum)
 ROB/GTT.  TDAP given in LD, tolerated well.  Pt needs to reschedule GTT labs, she threw up the Glucola.

## 2024-02-16 NOTE — Progress Notes (Signed)
   PRENATAL VISIT NOTE  Subjective:  Brenda Hanson is a 18 y.o. G1P0000 at [redacted]w[redacted]d being seen today for ongoing prenatal care.  She is currently monitored for the following issues for this high-risk pregnancy and has Encounter for supervision of other normal pregnancy, second trimester; Chlamydia infection affecting pregnancy in second trimester, antepartum; Thalassemia alpha carrier; Cervical insufficiency during pregnancy in second trimester, antepartum; Late prenatal care affecting pregnancy in second trimester; and Anxiety on their problem list.  Patient reports vaginal discharge. Wants to make sure the chlamydia didn't come back but states she and her partner have been compliant with treatment. Threw up her GTT this morning.  Contractions: Not present. Vag. Bleeding: None.  Movement: Present. Denies leaking of fluid.   The following portions of the patient's history were reviewed and updated as appropriate: allergies, current medications, past family history, past medical history, past social history, past surgical history and problem list.   Objective:   Vitals:   02/16/24 0915  BP: 108/69  Pulse: (!) 102  Weight: 153 lb (69.4 kg)   Body mass index is 27.54 kg/m. Total weight gain: 28 lb (12.7 kg)   Fetal Status: Fetal Heart Rate (bpm): 146 Fundal Height: 27 cm Movement: Present     General:  Alert, oriented and cooperative. Patient is in no acute distress.  Skin: Skin is warm and dry. No rash noted.   Cardiovascular: Normal heart rate noted  Respiratory: Normal respiratory effort, no problems with respiration noted  Abdomen: Soft, gravid, appropriate for gestational age.  Pain/Pressure: Absent     Pelvic: Cervical exam deferred        Extremities: Normal range of motion.  Edema: Trace  Mental Status: Normal mood and affect. Normal behavior. Normal judgment and thought content.   Assessment and Plan:  Pregnancy: G1P0000 at [redacted]w[redacted]d 1. Supervision of high risk pregnancy in third  trimester (Primary) Anticipatory guidance Fetal kick counts reviewed Tdap today Plans to breastfeed, classes & pumps reviewed Threw up the GTT, needs to repeat - Glucose Tolerance, 2 Hours w/1 Hour - RPR - CBC - HIV antibody (with reflex) - Tdap vaccine greater than or equal to 7yo IM  2. [redacted] weeks gestation of pregnancy - Glucose Tolerance, 2 Hours w/1 Hour - RPR - CBC - HIV antibody (with reflex) - Tdap vaccine greater than or equal to 7yo IM  3. Cervical cerclage suture present, antepartum No signs/symptoms of PTL at this time  4. Chlamydia infection affecting pregnancy, antepartum TOC neg, repeat testing third trimester - Cervicovaginal ancillary only  5. Vaginal discharge - Cervicovaginal ancillary only   Preterm labor symptoms and general obstetric precautions including but not limited to vaginal bleeding, contractions, leaking of fluid and fetal movement were reviewed in detail with the patient. Please refer to After Visit Summary for other counseling recommendations.   Return in about 2 weeks (around 03/01/2024) for ROB & repeat GTT.  Future Appointments  Date Time Provider Department Center  02/29/2024  9:00 AM [facility] None  02/29/2024  9:55 AM Delores Nidia CROME, FNP [facility] None    Rollo ONEIDA Bring, MD

## 2024-02-17 LAB — CERVICOVAGINAL ANCILLARY ONLY
Bacterial Vaginitis (gardnerella): NEGATIVE
Candida Glabrata: NEGATIVE
Candida Vaginitis: NEGATIVE
Chlamydia: NEGATIVE
Comment: NEGATIVE
Comment: NEGATIVE
Comment: NEGATIVE
Comment: NEGATIVE
Comment: NEGATIVE
Comment: NORMAL
Neisseria Gonorrhea: NEGATIVE
Trichomonas: NEGATIVE

## 2024-02-18 ENCOUNTER — Ambulatory Visit: Payer: Self-pay | Admitting: Obstetrics and Gynecology

## 2024-02-20 NOTE — Addendum Note (Signed)
 Addendum  created 02/20/24 1356 by Jefm Garnette LABOR, MD   Intraprocedure Staff edited

## 2024-02-29 ENCOUNTER — Other Ambulatory Visit

## 2024-02-29 ENCOUNTER — Ambulatory Visit: Payer: Self-pay | Admitting: Obstetrics and Gynecology

## 2024-02-29 ENCOUNTER — Encounter: Payer: Self-pay | Admitting: Obstetrics and Gynecology

## 2024-02-29 VITALS — BP 104/69 | HR 99 | Wt 158.2 lb

## 2024-02-29 DIAGNOSIS — O0993 Supervision of high risk pregnancy, unspecified, third trimester: Secondary | ICD-10-CM | POA: Diagnosis not present

## 2024-02-29 DIAGNOSIS — Z8659 Personal history of other mental and behavioral disorders: Secondary | ICD-10-CM | POA: Diagnosis not present

## 2024-02-29 DIAGNOSIS — Z3482 Encounter for supervision of other normal pregnancy, second trimester: Secondary | ICD-10-CM

## 2024-02-29 DIAGNOSIS — O3430 Maternal care for cervical incompetence, unspecified trimester: Secondary | ICD-10-CM | POA: Diagnosis not present

## 2024-02-29 DIAGNOSIS — Z3A29 29 weeks gestation of pregnancy: Secondary | ICD-10-CM

## 2024-02-29 NOTE — Progress Notes (Signed)
   PRENATAL VISIT NOTE  Subjective:  Brenda Hanson is a 18 y.o. G1P0000 at [redacted]w[redacted]d being seen today for ongoing prenatal care.  She is currently monitored for the following issues for this high-risk pregnancy and has Encounter for supervision of other normal pregnancy, second trimester; Chlamydia infection affecting pregnancy in second trimester, antepartum; Thalassemia alpha carrier; Cervical insufficiency during pregnancy in second trimester, antepartum; Late prenatal care affecting pregnancy in second trimester; and Anxiety on their problem list.  Patient reports intermittent lower pelvic pressure, especially with activity.  Contractions: Irritability. Vag. Bleeding: None.  Movement: Increased. Denies leaking of fluid.   The following portions of the patient's history were reviewed and updated as appropriate: allergies, current medications, past family history, past medical history, past social history, past surgical history and problem list.   Objective:    Vitals:   02/29/24 0912  BP: 104/69  Pulse: 99  Weight: 158 lb 3.2 oz (71.8 kg)    Fetal Status:  Fetal Heart Rate (bpm): 138   Movement: Increased    General: Alert, oriented and cooperative. Patient is in no acute distress.  Skin: Skin is warm and dry. No rash noted.   Cardiovascular: Normal heart rate noted  Respiratory: Normal respiratory effort, no problems with respiration noted  Abdomen: Soft, gravid, appropriate for gestational age.  Pain/Pressure: Present     Pelvic: Cervical exam deferred        Extremities: Normal range of motion.  Edema: None  Mental Status: Normal mood and affect. Normal behavior. Normal judgment and thought content.   Assessment and Plan:  Pregnancy: G1P0000 at [redacted]w[redacted]d 1. Supervision of high risk pregnancy in third trimester (Primary) BP and FHR normal Doing well, feeling regular movement  FH appropriate  2. [redacted] weeks gestation of pregnancy Repeat GTT today  Peds list provided today Discussed  supportive measures for pelvic pressure, encouraged maternity support belt   3. Cervical cerclage suture present, antepartum No s&s PTL    4. History of anxiety Reports overall doing well, no meds, does not desire counseling at this time   Preterm labor symptoms and general obstetric precautions including but not limited to vaginal bleeding, contractions, leaking of fluid and fetal movement were reviewed in detail with the patient. Please refer to After Visit Summary for other counseling recommendations.   Return in about 2 weeks (around 03/14/2024) for OB VISIT (MD or APP).   Nidia Daring, FNP

## 2024-02-29 NOTE — Progress Notes (Signed)
 Here for repeat GTT.   Pt states everything is well. No concerns at this time.

## 2024-02-29 NOTE — Patient Instructions (Signed)
 [facility] Pediatric Providers  [place] ([id number])  [facility]) - [facility], MD; Manson Passey, MD; Ave Filter, MD; Luna Fuse, MD; Kennedy Bucker, MD; Florestine Avers, MD; Melchor Amour, MD; Yetta Barre,  MD; Konrad Dolores, MD; Kathlene November, MD; Jenne Campus, MD; Wynetta Emery, MD; Duffy Rhody, MD; Gerre Couch, NP [address] [place], [place], [place] [address] [phone] Mon, Tue, Thur, Fri 8:30-5:30, Wed 9:30-5:30, Sat 8:30-12:30 Only accepting infants of first-time parents or siblings of current patients Hospital discharge coordinator will make follow-up appointment Medicaid - yes; Tricare - yes   [facility] - Pediatrics at [place] Radon, MD; Sabino Dick, MD; Quitman Livings, MD; Betha Loa, NP; Claretha Cooper, MD; Lelon Perla, MD [address] [place] [place], [place] [address] [phone] Mon-Fri 8:30-5:30 Medicaid - yes, Tricare - yes  [place] [phone]) [facility], MD [address], [place], [place] [address] [phone] Mon, Tues, Wed Fri 8:30-5:00, Sat 8:30-12:00, Closed Thursdays Accepting siblings of established patients and first time mom's if you call prenatally Medicaid- yes; Tricare - yes   [facility] [address], [place], [place] [address] [phone] Mon-Fri 8:30-5:00 (lunch 12:00-1:00) Medicaid -Yes; Tricare - Yes   [facility] [place], MD; [place], [place]; [place], [place]; [place], [place] [address] [place] [address] [phone] Mon-Fri 7:30-5:30 Medicaid - Yes; Sherolyn Buba  [place] [phone] & [phone])  [facility], MD [address] [place], [place] [address] [phone] Mon-Thur 8:00-6:00, closed for lunch 12-2, closed Fridays Medicaid - yes; Tricare - no  [facility] [name] Piano, NP; Cyndia Bent, MD; [place], [place]; [place], [place] [address], [place],  [place] [address] [phone] Mon-Fri 7:30-4:30 Medicaid - yes, Tricare - yes  [facility]  Juanito Doom, MD; Janene Harvey, NP; Vonita Moss, MD; Donn Pierini, NP [address], [place], [place] [address] [phone] Mon-Fri 8:30-5:00, closed for lunch 1-2, Sat 8:30-12:00 - sick visits only Providers come to see babies at [facility] Only accepting newborns of siblings and first time parents ONLY if who have met with office prior to delivery Medicaid -Yes; Tricare - yes  [facility] - [place], [place]; Spero Geralds, NP; Earlene Plater, MD; Lucretia Roers, MD:  [address], [place], [place] [address] [phone] Mon- Fri 8:00-5:00, Sat 9:00-12:00 - sick visits only Accepting siblings of established patients and first time mom/baby Medicaid - Yes; Tricare - yes Patients must have vaccinations (baby vaccines)   [employer] [phone])  [facility] Alfredo Bach, PA; [place], [place]; Eddie Candle, MD; Normand Sloop, MD; [place], NP; [place], DO; [place], [place]; Constance Goltz, MD; Ruthann Cancer, MD; Vear Clock, MD; [place], [place]; [place], [place]; [place], [place] [address] [facility] [address], [place], [place] [address] [phone] Mon-Fri 8:30-5:00, Sat 9:00-12:00 - sick only Please register online [email or web address] then schedule online or call office Medicaid-Yes; Tricare -yes   [facility] - [facility] at [place] [facility] [place] [place], [place]; List, FNP; Berneda Rose, MD; Luther Redo, PA-C; Lavonia Drafts, MD; Kellie Simmering, FNP; Genevie Cheshire, FNP; Evaristo Bury, MD; Berneda Rose, MD [phone] [address] [place], [place] [address] [phone] Mon-Fri 8:30-5:30 Medicaid - Yes; Tricare - yes  [facility] - [address]  [place], [place]; Whitney Post, MD; Hennie Duos, MD; Wynne Dust, MD; [place], NP [address], [place], [place] [address] [phone] Mon-Thur 8:00-5:30, Fri 8:00-5:00, Sat 9:00-12:00 Medicaid - yes, Tricare - yes

## 2024-03-01 ENCOUNTER — Ambulatory Visit: Payer: Self-pay | Admitting: Obstetrics and Gynecology

## 2024-03-01 DIAGNOSIS — Z3482 Encounter for supervision of other normal pregnancy, second trimester: Secondary | ICD-10-CM

## 2024-03-01 LAB — GLUCOSE TOLERANCE, 2 HOURS W/ 1HR
Glucose, 1 hour: 66 mg/dL — ABNORMAL LOW (ref 70–179)
Glucose, 2 hour: 101 mg/dL (ref 70–152)
Glucose, Fasting: 73 mg/dL (ref 70–91)

## 2024-03-13 ENCOUNTER — Ambulatory Visit: Admitting: Obstetrics & Gynecology

## 2024-03-13 ENCOUNTER — Encounter: Payer: Self-pay | Admitting: Obstetrics & Gynecology

## 2024-03-13 VITALS — BP 92/53 | HR 82 | Wt 155.6 lb

## 2024-03-13 DIAGNOSIS — Z3A31 31 weeks gestation of pregnancy: Secondary | ICD-10-CM

## 2024-03-13 DIAGNOSIS — O3430 Maternal care for cervical incompetence, unspecified trimester: Secondary | ICD-10-CM | POA: Diagnosis not present

## 2024-03-13 DIAGNOSIS — Z3480 Encounter for supervision of other normal pregnancy, unspecified trimester: Secondary | ICD-10-CM

## 2024-03-13 NOTE — Progress Notes (Signed)
   PRENATAL VISIT NOTE  Subjective:  Brenda Hanson is a 18 y.o. G1P0000 at [redacted]w[redacted]d being seen today for ongoing prenatal care.  She is currently monitored for the following issues for this low-risk pregnancy and has Encounter for supervision of other normal pregnancy, third trimester; Chlamydia infection affecting pregnancy in second trimester, antepartum; Thalassemia alpha carrier; Cervical insufficiency during pregnancy in second trimester, antepartum; Late prenatal care affecting pregnancy in second trimester; and Anxiety on their problem list.  Patient reports no complaints.  Contractions: Irritability. Vag. Bleeding: None.  Movement: Present. Denies leaking of fluid.   The following portions of the patient's history were reviewed and updated as appropriate: allergies, current medications, past family history, past medical history, past social history, past surgical history and problem list.   Objective:    Vitals:   03/13/24 0901  BP: (!) 92/53  Pulse: 82  Weight: 155 lb 9.6 oz (70.6 kg)    Fetal Status:  Fetal Heart Rate (bpm): 150 Fundal Height: 31 cm Movement: Present    General: Alert, oriented and cooperative. Patient is in no acute distress.  Skin: Skin is warm and dry. No rash noted.   Cardiovascular: Normal heart rate noted  Respiratory: Normal respiratory effort, no problems with respiration noted  Abdomen: Soft, gravid, appropriate for gestational age.  Pain/Pressure: Absent     Pelvic: Cervical exam deferred        Extremities: Normal range of motion.  Edema: None  Mental Status: Normal mood and affect. Normal behavior. Normal judgment and thought content.   Assessment and Plan:  Pregnancy: G1P0000 at [redacted]w[redacted]d 1. Cervical cerclage suture present, antepartum 2. [redacted] weeks gestation of pregnancy 3. Supervision of other normal pregnancy, antepartum (Primary) Normal third trimester labs. No other concerns. She was given information about RSV vaccine. Preterm labor symptoms  and general obstetric precautions including but not limited to vaginal bleeding, contractions, leaking of fluid and fetal movement were reviewed in detail with the patient. Please refer to After Visit Summary for other counseling recommendations.   Return in about 2 weeks (around 03/27/2024) for OFFICE OB VISIT (MD or APP).  Future Appointments  Date Time Provider Department Center  03/27/2024 10:15 AM [place]-Kirby, Olam LABOR, CNM [facility] None  04/10/2024  9:35 AM Davis, Devon E, PA-C [facility] None    Gloris Hugger, MD

## 2024-03-13 NOTE — Patient Instructions (Signed)
 TDaP Vaccine Pregnancy Get the Whooping Cough Vaccine While You Are Pregnant (CDC)  It is important for women to get the whooping cough vaccine in the third trimester of each pregnancy. Vaccines are the best way to prevent this disease. There are 2 different whooping cough vaccines. Both vaccines combine protection against whooping cough, tetanus and diphtheria, but they are for different age groups: Tdap: for everyone 11 years or older, including pregnant women  DTaP: for children 2 months through 10 years of age  You need the whooping cough vaccine during each of your pregnancies The recommended time to get the shot is during your 27th through 36th week of pregnancy, preferably during the earlier part of this time period. The Centers for Disease Control and Prevention (CDC) recommends that pregnant women receive the whooping cough vaccine for adolescents and adults (called Tdap vaccine) during the third trimester of each pregnancy. The recommended time to get the shot is during your 27th through 36th week of pregnancy, preferably during the earlier part of this time period. This replaces the original recommendation that pregnant women get the vaccine only if they had not previously received it. The [employer] of Obstetricians and Gynecologists and the [employer] support this recommendation.  You should get the whooping cough vaccine while pregnant to pass protection to your baby frame support disabled and/or not supported in this browser  Learn why Vernona Rieger decided to get the whooping cough vaccine in her 3rd trimester of pregnancy and how her baby girl was born with some protection against the disease. Also available on YouTube. After receiving the whooping cough vaccine, your body will create protective antibodies (proteins produced by the body to fight off diseases) and pass some of them to your baby before birth. These antibodies provide your baby some short-term  protection against whooping cough in early life. These antibodies can also protect your baby from some of the more serious complications that come along with whooping cough. Your protective antibodies are at their highest about 2 weeks after getting the vaccine, but it takes time to pass them to your baby. So the preferred time to get the whooping cough vaccine is early in your third trimester. The amount of whooping cough antibodies in your body decreases over time. That is why CDC recommends you get a whooping cough vaccine during each pregnancy. Doing so allows each of your babies to get the greatest number of protective antibodies from you. This means each of your babies will get the best protection possible against this disease.  Getting the whooping cough vaccine while pregnant is better than getting the vaccine after you give birth Whooping cough vaccination during pregnancy is ideal so your baby will have short-term protection as soon as he is born. This early protection is important because your baby will not start getting his whooping cough vaccines until he is 2 months old. These first few months of life are when your baby is at greatest risk for catching whooping cough. This is also when he's at greatest risk for having severe, potentially life-threating complications from the infection. To avoid that gap in protection, it is best to get a whooping cough vaccine during pregnancy. You will then pass protection to your baby before he is born. To continue protecting your baby, he should get whooping cough vaccines starting at 2 months old. You may never have gotten the Tdap vaccine before and did not get it during this pregnancy. If so, you should make sure  to get the vaccine immediately after you give birth, before leaving the hospital or birthing center. It will take about 2 weeks before your body develops protection (antibodies) in response to the vaccine. Once you have protection from the vaccine,  you are less likely to give whooping cough to your newborn while caring for him. But remember, your baby will still be at risk for catching whooping cough from others. A recent study looked to see how effective Tdap was at preventing whooping cough in babies whose mothers got the vaccine while pregnant or in the hospital after giving birth. The study found that getting Tdap between 27 through 36 weeks of pregnancy is 85% more effective at preventing whooping cough in babies younger than 2 months old. Blood tests cannot tell if you need a whooping cough vaccine There are no blood tests that can tell you if you have enough antibodies in your body to protect yourself or your baby against whooping cough. Even if you have been sick with whooping cough in the past or previously received the vaccine, you still should get the vaccine during each pregnancy. Breastfeeding may pass some protective antibodies onto your baby By breastfeeding, you may pass some antibodies you have made in response to the vaccine to your baby. When you get a whooping cough vaccine during your pregnancy, you will have antibodies in your breast milk that you can share with your baby as soon as your milk comes in. However, your baby will not get protective antibodies immediately if you wait to get the whooping cough vaccine until after delivering your baby. This is because it takes about 2 weeks for your body to create antibodies. Learn more about the health benefits of breastfeeding.

## 2024-03-27 ENCOUNTER — Encounter: Admitting: Advanced Practice Midwife

## 2024-04-04 ENCOUNTER — Ambulatory Visit (INDEPENDENT_AMBULATORY_CARE_PROVIDER_SITE_OTHER): Admitting: Obstetrics and Gynecology

## 2024-04-04 VITALS — BP 109/68 | HR 95 | Wt 155.0 lb

## 2024-04-04 DIAGNOSIS — Z3A34 34 weeks gestation of pregnancy: Secondary | ICD-10-CM

## 2024-04-04 DIAGNOSIS — O3433 Maternal care for cervical incompetence, third trimester: Secondary | ICD-10-CM | POA: Diagnosis not present

## 2024-04-04 DIAGNOSIS — O3430 Maternal care for cervical incompetence, unspecified trimester: Secondary | ICD-10-CM

## 2024-04-04 DIAGNOSIS — Z3480 Encounter for supervision of other normal pregnancy, unspecified trimester: Secondary | ICD-10-CM

## 2024-04-04 NOTE — Progress Notes (Signed)
 C/o low back pressure on and off. Denies other concerns.

## 2024-04-04 NOTE — Progress Notes (Signed)
   PRENATAL VISIT NOTE  Subjective:  Brenda Hanson is a 18 y.o. G1P0000 at [redacted]w[redacted]d being seen today for ongoing prenatal care.  She is currently monitored for the following issues for this high-risk pregnancy and has Encounter for supervision of other normal pregnancy, third trimester; Chlamydia infection affecting pregnancy in second trimester, antepartum; Thalassemia alpha carrier; Cervical insufficiency during pregnancy in second trimester, antepartum; Late prenatal care affecting pregnancy in second trimester; and Anxiety on their problem list.  Patient reports low back pain especially at work, standing long period.  Contractions: Not present. Vag. Bleeding: None.  Movement: Present. Denies leaking of fluid.   The following portions of the patient's history were reviewed and updated as appropriate: allergies, current medications, past family history, past medical history, past social history, past surgical history and problem list.   Objective:   Vitals:   04/04/24 1051  BP: 109/68  Pulse: 95  Weight: 155 lb (70.3 kg)    Fetal Status:  Fetal Heart Rate (bpm): 138   Movement: Present    General: Alert, oriented and cooperative. Patient is in no acute distress.  Skin: Skin is warm and dry. No rash noted.   Cardiovascular: Normal heart rate noted  Respiratory: Normal respiratory effort, no problems with respiration noted  Abdomen: Soft, gravid, appropriate for gestational age.  Pain/Pressure: Absent     Pelvic: Cervical exam deferred        Extremities: Normal range of motion.  Edema: None  Mental Status: Normal mood and affect. Normal behavior. Normal judgment and thought content.    Assessment and Plan:  Pregnancy: G1P0000 at [redacted]w[redacted]d 1. Supervision of other normal pregnancy, antepartum (Primary) BP and FHR normal Doing well, feeling regular movement    2. Cervical cerclage suture present, antepartum No s&s   3. [redacted] weeks gestation of pregnancy Anticipatory guidance regarding  swabs next visit  Supportive measures discussed regarding back pain, encouraged maternity support belt   Preterm labor symptoms and general obstetric precautions including but not limited to vaginal bleeding, contractions, leaking of fluid and fetal movement were reviewed in detail with the patient. Please refer to After Visit Summary for other counseling recommendations.    Future Appointments  Date Time Provider Department Center  04/10/2024  9:35 AM Davis, Devon E, PA-C [facility] None    Nidia Daring, [place]

## 2024-04-10 ENCOUNTER — Encounter: Admitting: Physician Assistant

## 2024-04-10 NOTE — Progress Notes (Deleted)
   PRENATAL VISIT NOTE  Subjective:  Brenda Hanson is a 18 y.o. G1P0000 at [redacted]w[redacted]d being seen today for ongoing prenatal care.  She is currently monitored for the following issues for this {Blank single:19197::high-risk,low-risk} pregnancy and has Encounter for supervision of other normal pregnancy, third trimester; Chlamydia infection affecting pregnancy in second trimester, antepartum; Thalassemia alpha carrier; Cervical insufficiency during pregnancy in second trimester, antepartum; Late prenatal care affecting pregnancy in second trimester; and Anxiety on their problem list.  Patient reports {sx:14538}.   .  .   . Denies leaking of fluid.   The following portions of the patient's history were reviewed and updated as appropriate: allergies, current medications, past family history, past medical history, past social history, past surgical history and problem list.   Objective:   There were no vitals filed for this visit.  Fetal Status:           General: Alert, oriented and cooperative. Patient is in no acute distress.  Skin: Skin is warm and dry. No rash noted.   Cardiovascular: Normal heart rate noted  Respiratory: Normal respiratory effort, no problems with respiration noted  Abdomen: Soft, gravid, appropriate for gestational age.        Pelvic: {Blank single:19197::Cervical exam performed in the presence of a chaperone,Cervical exam deferred}        Extremities: Normal range of motion.     Mental Status: Normal mood and affect. Normal behavior. Normal judgment and thought content.      02/16/2024    9:19 AM 12/05/2023    8:47 AM  Depression screen PHQ 2/9  Decreased Interest 0 0  Down, Depressed, Hopeless 0 0  PHQ - 2 Score 0 0  Altered sleeping 0 0  Tired, decreased energy 3 0  Change in appetite 0 0  Feeling bad or failure about yourself  0 0  Trouble concentrating 0 0  Moving slowly or fidgety/restless 0 0  Suicidal thoughts 0 0  PHQ-9 Score 3  0   Difficult doing  work/chores Not difficult at all      Data saved with a previous flowsheet row definition        02/16/2024    9:20 AM 12/05/2023    8:47 AM  GAD 7 : Generalized Anxiety Score  Nervous, Anxious, on Edge 0 1  Control/stop worrying 0 0  Worry too much - different things 0 0  Trouble relaxing 0 0  Restless 0 0  Easily annoyed or irritable 3 3  Afraid - awful might happen 0 0  Total GAD 7 Score 3 4  Anxiety Difficulty Not difficult at all     Assessment and Plan:  Pregnancy: G1P0000 at [redacted]w[redacted]d  1. Encounter for supervision of other normal pregnancy, third trimester (Primary) ***  2. [redacted] weeks gestation of pregnancy ***  3. Chlamydia infection affecting pregnancy in second trimester, antepartum ***  4. Late prenatal care affecting pregnancy in second trimester ***  5. Cervical insufficiency during pregnancy in second trimester, antepartum ***  6. Thalassemia alpha carrier ***  7. Anxiety ***  {Blank single:19197::Term,Preterm} labor symptoms and general obstetric precautions including but not limited to vaginal bleeding, contractions, leaking of fluid and fetal movement were reviewed in detail with the patient. Please refer to After Visit Summary for other counseling recommendations.   No follow-ups on file.  Future Appointments  Date Time Provider Department Center  04/10/2024  9:35 AM Grisell Bissette E, PA-C [facility] None    Traveon Louro E Wallace Gappa, PA-C

## 2024-04-17 ENCOUNTER — Ambulatory Visit: Admitting: Obstetrics & Gynecology

## 2024-04-17 ENCOUNTER — Other Ambulatory Visit (HOSPITAL_COMMUNITY)
Admission: RE | Admit: 2024-04-17 | Discharge: 2024-04-17 | Disposition: A | Discharge status: Home / Self Care | Source: Ambulatory Visit | Attending: Obstetrics & Gynecology | Admitting: Obstetrics & Gynecology

## 2024-04-17 ENCOUNTER — Encounter: Payer: Self-pay | Admitting: Obstetrics & Gynecology

## 2024-04-17 VITALS — BP 112/75 | HR 102 | Wt 155.8 lb

## 2024-04-17 DIAGNOSIS — O3430 Maternal care for cervical incompetence, unspecified trimester: Secondary | ICD-10-CM

## 2024-04-17 DIAGNOSIS — O3432 Maternal care for cervical incompetence, second trimester: Secondary | ICD-10-CM | POA: Diagnosis not present

## 2024-04-17 DIAGNOSIS — Z3A36 36 weeks gestation of pregnancy: Secondary | ICD-10-CM | POA: Insufficient documentation

## 2024-04-17 DIAGNOSIS — O0993 Supervision of high risk pregnancy, unspecified, third trimester: Secondary | ICD-10-CM

## 2024-04-17 DIAGNOSIS — Z3483 Encounter for supervision of other normal pregnancy, third trimester: Secondary | ICD-10-CM

## 2024-04-17 NOTE — Progress Notes (Signed)
 Patient ID: Brenda Hanson, female   DOB: 12-26-2005, 18 y.o.     Patient desires surgical management with cerclage removal at 36.3 weeks.  The risks of surgery were discussed in detail with the patient. All questions were answered.  Pelvic exam: normal external genitalia, vulva, vagina, cervix, uterus and adnexa, VULVA: normal, VAGINA: normal appearing vagina with normal color and discharge, no lesions, CERVIX: Merseline tape suture visible with speculum . Tape was grasped and cut below the knot with scissors and entire stitch removed and good hemostasis observed  Eveline Lynwood MATSU, MD

## 2024-04-17 NOTE — Progress Notes (Signed)
 ROB   36 week swabs today. Pt had cervical cerclage placed 01/20/24, asking about removal date.

## 2024-04-17 NOTE — Progress Notes (Signed)
 PRENATAL VISIT NOTE  Subjective:  Brenda Hanson is a 18 y.o. G1P0000 at [redacted]w[redacted]d being seen today for ongoing prenatal care.  She is currently monitored for the following issues for this high-risk pregnancy and has Encounter for supervision of other normal pregnancy, third trimester; Chlamydia infection affecting pregnancy in second trimester, antepartum; Thalassemia alpha carrier; Cervical insufficiency during pregnancy in second trimester, antepartum; Late prenatal care affecting pregnancy in second trimester; and Anxiety on their problem list.  Patient reports no complaints.  Contractions: Irritability. Vag. Bleeding: None.  Movement: Present. Denies leaking of fluid.   The following portions of the patient's history were reviewed and updated as appropriate: allergies, current medications, past family history, past medical history, past social history, past surgical history and problem list.   Objective:   Vitals:   04/17/24 1449  BP: 112/75  Pulse: (!) 102  Weight: 155 lb 12.8 oz (70.7 kg)    Fetal Status:  Fetal Heart Rate (bpm): 138   Movement: Present    General: Alert, oriented and cooperative. Patient is in no acute distress.  Skin: Skin is warm and dry. No rash noted.   Cardiovascular: Normal heart rate noted  Respiratory: Normal respiratory effort, no problems with respiration noted  Abdomen: Soft, gravid, appropriate for gestational age.  Pain/Pressure: Absent     Pelvic: Cervical exam performed in the presence of a chaperone        Extremities: Normal range of motion.  Edema: None  Mental Status: Normal mood and affect. Normal behavior. Normal judgment and thought content.      02/16/2024    9:19 AM 12/05/2023    8:47 AM  Depression screen PHQ 2/9  Decreased Interest 0 0  Down, Depressed, Hopeless 0 0  PHQ - 2 Score 0 0  Altered sleeping 0 0  Tired, decreased energy 3 0  Change in appetite 0 0  Feeling bad or failure about yourself  0 0  Trouble concentrating 0 0   Moving slowly or fidgety/restless 0 0  Suicidal thoughts 0 0  PHQ-9 Score 3  0   Difficult doing work/chores Not difficult at all      Data saved with a previous flowsheet row definition        02/16/2024    9:20 AM 12/05/2023    8:47 AM  GAD 7 : Generalized Anxiety Score  Nervous, Anxious, on Edge 0 1  Control/stop worrying 0 0  Worry too much - different things 0 0  Trouble relaxing 0 0  Restless 0 0  Easily annoyed or irritable 3 3  Afraid - awful might happen 0 0  Total GAD 7 Score 3 4  Anxiety Difficulty Not difficult at all     Assessment and Plan:  Pregnancy: G1P0000 at [redacted]w[redacted]d 1. Supervision of high risk pregnancy in third trimester (Primary)  - Culture, beta strep (group b only) - Cervicovaginal ancillary only( [facility])  2. [redacted] weeks gestation of pregnancy  - Culture, beta strep (group b only) - Cervicovaginal ancillary only( )  3. Cervical insufficiency during pregnancy in second trimester, antepartum Cerclage removed  4. Encounter for supervision of other normal pregnancy, third trimester   5. Cervical cerclage suture present, antepartum Removed   Preterm labor symptoms and general obstetric precautions including but not limited to vaginal bleeding, contractions, leaking of fluid and fetal movement were reviewed in detail with the patient. Please refer to After Visit Summary for other counseling recommendations.   No follow-ups on file.  No future appointments.  Lynwood Solomons, MD

## 2024-04-18 LAB — CERVICOVAGINAL ANCILLARY ONLY
Chlamydia: NEGATIVE
Comment: NEGATIVE
Comment: NORMAL
Neisseria Gonorrhea: NEGATIVE

## 2024-04-20 ENCOUNTER — Inpatient Hospital Stay (HOSPITAL_COMMUNITY)
Admission: AD | Admit: 2024-04-20 | Discharge: 2024-04-20 | Disposition: A | Discharge status: Home / Self Care | Attending: Obstetrics and Gynecology | Admitting: Obstetrics and Gynecology

## 2024-04-20 DIAGNOSIS — O479 False labor, unspecified: Secondary | ICD-10-CM

## 2024-04-20 DIAGNOSIS — O4703 False labor before 37 completed weeks of gestation, third trimester: Secondary | ICD-10-CM | POA: Insufficient documentation

## 2024-04-20 DIAGNOSIS — O99343 Other mental disorders complicating pregnancy, third trimester: Secondary | ICD-10-CM | POA: Insufficient documentation

## 2024-04-20 DIAGNOSIS — O99013 Anemia complicating pregnancy, third trimester: Secondary | ICD-10-CM | POA: Diagnosis not present

## 2024-04-20 DIAGNOSIS — O3433 Maternal care for cervical incompetence, third trimester: Secondary | ICD-10-CM | POA: Insufficient documentation

## 2024-04-20 DIAGNOSIS — F419 Anxiety disorder, unspecified: Secondary | ICD-10-CM | POA: Diagnosis not present

## 2024-04-20 DIAGNOSIS — Z3A36 36 weeks gestation of pregnancy: Secondary | ICD-10-CM | POA: Insufficient documentation

## 2024-04-20 DIAGNOSIS — O0933 Supervision of pregnancy with insufficient antenatal care, third trimester: Secondary | ICD-10-CM | POA: Diagnosis not present

## 2024-04-20 LAB — COMPREHENSIVE METABOLIC PANEL WITH GFR
ALT: 13 U/L (ref 0–44)
AST: 20 U/L (ref 15–41)
Albumin: 2.9 g/dL — ABNORMAL LOW (ref 3.5–5.0)
Alkaline Phosphatase: 99 U/L (ref 38–126)
Anion gap: 9 (ref 5–15)
BUN: 7 mg/dL (ref 6–20)
CO2: 19 mmol/L — ABNORMAL LOW (ref 22–32)
Calcium: 8.3 mg/dL — ABNORMAL LOW (ref 8.9–10.3)
Chloride: 106 mmol/L (ref 98–111)
Creatinine, Ser: 0.53 mg/dL (ref 0.44–1.00)
GFR, Estimated: >60 mL/min (ref >60)
Glucose, Bld: 90 mg/dL (ref 70–99)
Potassium: 3.5 mmol/L (ref 3.5–5.1)
Sodium: 134 mmol/L — ABNORMAL LOW (ref 135–145)
Total Bilirubin: 0.3 mg/dL (ref 0.0–1.2)
Total Protein: 6.2 g/dL — ABNORMAL LOW (ref 6.5–8.1)

## 2024-04-20 LAB — CBC
HCT: 28 % — ABNORMAL LOW (ref 36.0–46.0)
Hemoglobin: 8.9 g/dL — ABNORMAL LOW (ref 12.0–15.0)
MCH: 22 pg — ABNORMAL LOW (ref 26.0–34.0)
MCHC: 31.8 g/dL (ref 30.0–36.0)
MCV: 69.1 fL — ABNORMAL LOW (ref 80.0–100.0)
Platelets: 252 K/uL (ref 150–400)
RBC: 4.05 MIL/uL (ref 3.87–5.11)
RDW: 14.7 % (ref 11.5–15.5)
WBC: 12.4 K/uL — ABNORMAL HIGH (ref 4.0–10.5)
nRBC: 0 % (ref 0.0–0.2)

## 2024-04-20 LAB — URINALYSIS, ROUTINE W REFLEX MICROSCOPIC
Bilirubin Urine: NEGATIVE
Glucose, UA: NEGATIVE mg/dL
Hgb urine dipstick: NEGATIVE
Ketones, ur: NEGATIVE mg/dL
Leukocytes,Ua: NEGATIVE
Nitrite: NEGATIVE
Protein, ur: NEGATIVE mg/dL
Specific Gravity, Urine: 1.012 (ref 1.005–1.030)
pH: 7 (ref 5.0–8.0)

## 2024-04-20 LAB — PROTEIN / CREATININE RATIO, URINE
Creatinine, Urine: 60 mg/dL
Protein Creatinine Ratio: 0.18 mg/mg{creat} — ABNORMAL HIGH (ref 0.00–0.15)
Total Protein, Urine: 11 mg/dL

## 2024-04-20 MED ORDER — FERROUS SULFATE 325 (65 FE) MG PO TABS: ORAL_TABLET | ORAL | 11 refills | Status: AC

## 2024-04-20 MED ORDER — LACTATED RINGERS IV BOLUS
1000.0000 mL | Freq: Once | INTRAVENOUS | Status: AC
  Administered 2024-04-20: 1000 mL via INTRAVENOUS

## 2024-04-20 NOTE — MAU Provider Note (Signed)
 Chief Complaint:  Contractions   HPI   None     Brenda Hanson is a 18 y.o. G1P0000 at [redacted]w[redacted]d who presents to maternity admissions reporting contractions. She reports contractions started around 1500 today. Denies vaginal bleeding, leaking of fluid. Endorses good fetal movement. She had a cervical cerclage that was removed 2 days ago, her cervix was not dilated at that time. Denies headache, vision changes, RUQ pain.   Pregnancy Course: Rece[facility] . Prenatal records reviewed. Pregnancy complicated by cervical insufficiency, chlamydia, late prenatal care, anxiety, thalassemia alpha carrier.  Past Medical History:  Diagnosis Date   Medical history non-contributory    OB History  Gravida Para Term Preterm AB Living  1 0 0 0 0 0  SAB IAB Ectopic Multiple Live Births  0 0 0 0 0    # Outcome Date GA Lbr Len/2nd Weight Sex Type Anes PTL Lv  1 Current            Past Surgical History:  Procedure Laterality Date   CERVICAL CERCLAGE N/A 01/20/2024   Procedure: CERCLAGE, CERVIX, VAGINAL APPROACH;  Surgeon: William Glenn, DO;  Location: [facility];  Service: Obstetrics;  Laterality: N/A;   NO PAST SURGERIES     Family History  Problem Relation Age of Onset   Heart disease Mother        fluid around heart   Healthy Father    Social History   Tobacco Use   Smoking status: Never   Smokeless tobacco: Never  Vaping Use   Vaping status: Former   Substances: Nicotine, Flavoring  Substance Use Topics   Alcohol use: Never   Drug use: Never   Allergies  Allergen Reactions   Pineapple Other (See Comments)    Tongue burning   Medications Prior to Admission  Medication Sig Dispense Refill Last Dose/Taking   aspirin  EC 81 MG tablet Take 1 tablet (81 mg total) by mouth at bedtime. Start taking when you are [redacted] weeks pregnant for rest of pregnancy for prevention of preeclampsia 300 tablet 2 Past Month   ondansetron  (ZOFRAN -ODT) 8 MG disintegrating  tablet Take 1 tablet (8 mg total) by mouth every 8 (eight) hours as needed for nausea or vomiting. 20 tablet 0 Past Month   Prenatal Vit-Fe Phos-FA-Omega (VITAFOL  GUMMIES) 3.33-0.333-34.8 MG CHEW Chew 1 tablet by mouth daily. 90 tablet 5 04/19/2024   Blood Pressure Monitoring (BLOOD PRESSURE KIT) DEVI 1 Device by Does not apply route once a week. 1 each 0    fluticasone (FLONASE) 50 MCG/ACT nasal spray Place 1 spray into both nostrils daily as needed for allergies. (Patient not taking: Reported on 04/04/2024)      loratadine (CLARITIN) 10 MG tablet Take 10 mg by mouth daily as needed for allergies. (Patient not taking: Reported on 04/04/2024)       I have reviewed patient's Past Medical Hx, Surgical Hx, Family Hx, Social Hx, medications and allergies.   ROS  Pertinent items noted in HPI and remainder of comprehensive ROS otherwise negative.   PHYSICAL EXAM  Patient Vitals for the past 24 hrs:  BP Temp Temp src Pulse Resp SpO2 Height Weight  04/20/24 1645 112/62 -- -- 91 -- -- -- --  04/20/24 1630 120/75 -- -- 98 -- -- -- --  04/20/24 1615 117/73 -- -- 100 -- -- -- --  04/20/24 1600 114/67 -- -- 96 -- -- -- --  04/20/24 1547 (!) 121/90 98.2 F (36.8 C) Oral  99 16 100 % 5' 2 (1.575 m) 70 kg    Constitutional: Well-developed, well-nourished female in no acute distress.  Cardiovascular: normal rate & rhythm, warm and well-perfused Respiratory: normal effort, no problems with respiration noted GI: Gravid MSK: Extremities nontender, no edema, normal ROM Skin: warm and dry. Acyanotic, no jaundice or pallor. Neurologic: Alert and oriented x 4. No abnormal coordination. Psychiatric: Normal mood. Speech not slurred, not rapid/pressured. Patient is cooperative.  Dilation: 1.5 Effacement (%): 80 Station: -2 Presentation: Vertex Exam by:: Nat Mule, RN  Fetal Tracing: Baseline FHR: 140 per minute Fetal heart variability: moderate Fetal Heart Rate accelerations: yes Fetal Heart  Rate decelerations: none Fetal Non-stress Test: Category I (reactive) Toco: irregular uterine contractions every 1-4 minutes  Labs: Results for orders placed or performed during the hospital encounter of 04/20/24 (from the past 24 hours)  Urinalysis, Routine w reflex microscopic -Urine, Clean Catch     Status: Abnormal   Collection Time: 04/20/24  4:06 PM  Result Value Ref Range   Color, Urine STRAW (A) YELLOW   APPearance CLEAR CLEAR   Specific Gravity, Urine 1.012 1.005 - 1.030   pH 7.0 5.0 - 8.0   Glucose, UA NEGATIVE NEGATIVE mg/dL   Hgb urine dipstick NEGATIVE NEGATIVE   Bilirubin Urine NEGATIVE NEGATIVE   Ketones, ur NEGATIVE NEGATIVE mg/dL   Protein, ur NEGATIVE NEGATIVE mg/dL   Nitrite NEGATIVE NEGATIVE   Leukocytes,Ua NEGATIVE NEGATIVE   RBC / HPF 0-5 0 - 5 RBC/hpf   WBC, UA 0-5 0 - 5 WBC/hpf   Bacteria, UA FEW (A) NONE SEEN   Squamous Epithelial / HPF 0-5 0 - 5 /HPF   Mucus PRESENT    Sperm, UA PRESENT   CBC     Status: Abnormal   Collection Time: 04/20/24  4:06 PM  Result Value Ref Range   WBC 12.4 (H) 4.0 - 10.5 K/uL   RBC 4.05 3.87 - 5.11 MIL/uL   Hemoglobin 8.9 (L) 12.0 - 15.0 g/dL   HCT 71.9 (L) 63.9 - 53.9 %   MCV 69.1 (L) 80.0 - 100.0 fL   MCH 22.0 (L) 26.0 - 34.0 pg   MCHC 31.8 30.0 - 36.0 g/dL   RDW 85.2 88.4 - 84.4 %   Platelets 252 150 - 400 K/uL   nRBC 0.0 0.0 - 0.2 %  Comprehensive metabolic panel     Status: Abnormal   Collection Time: 04/20/24  4:06 PM  Result Value Ref Range   Sodium 134 (L) 135 - 145 mmol/L   Potassium 3.5 3.5 - 5.1 mmol/L   Chloride 106 98 - 111 mmol/L   CO2 19 (L) 22 - 32 mmol/L   Glucose, Bld 90 70 - 99 mg/dL   BUN 7 6 - 20 mg/dL   Creatinine, Ser 9.46 0.44 - 1.00 mg/dL   Calcium 8.3 (L) 8.9 - 10.3 mg/dL   Total Protein 6.2 (L) 6.5 - 8.1 g/dL   Albumin 2.9 (L) 3.5 - 5.0 g/dL   AST 20 15 - 41 U/L   ALT 13 0 - 44 U/L   Alkaline Phosphatase 99 38 - 126 U/L   Total Bilirubin 0.3 0.0 - 1.2 mg/dL   GFR, Estimated >39 >39  mL/min   Anion gap 9 5 - 15  Protein / creatinine ratio, urine     Status: Abnormal   Collection Time: 04/20/24  4:06 PM  Result Value Ref Range   Creatinine, Urine 60 mg/dL   Total Protein, Urine 11 mg/dL  Protein Creatinine Ratio 0.18 (H) 0.00 - 0.15 mg/mg[Cre]    Imaging:  No results found.  MDM & MAU COURSE  MDM: Moderate  MAU Course: -Initial BP elevated but normalized on recheck. -IV fluids for preterm contractions. -Initial cervical check 1.5/80%/-2. Discussed with patient the definition of labor and that we will make that determination based on repeat cervical check in 1-2 hours.  -CMP, CBC, urine protein/creatinine ratio to rule out preeclampsia, through low suspicion after normalized BP on recheck. -CBC with significant anemia, needs iron replacement. -CMP and urine protein/creatinine ratio within normal limits. -No change on repeat cervical exam.  Orders Placed This Encounter  Procedures   Urinalysis, Routine w reflex microscopic -Urine, Clean Catch   CBC   Comprehensive metabolic panel   Protein / creatinine ratio, urine   Discharge patient   Meds ordered this encounter  Medications   lactated ringers  bolus 1,000 mL   ferrous sulfate 325 (65 FE) MG tablet    Sig: Take 1 tablet (325 mg total) by mouth with breakfast on Monday, Wednesday, and Friday.    Dispense:  30 tablet    Refill:  11    ASSESSMENT   1. Uterine contractions at greater than 20 weeks of gestation   2. Anemia affecting pregnancy in third trimester   3. [redacted] weeks gestation of pregnancy     PLAN  Discharge home in stable condition with preterm labor precautions.  Start ferrous sulfate 325 mg three times weekly.     Allergies as of 04/20/2024       Reactions   Pineapple Other (See Comments)   Tongue burning        Medication List     TAKE these medications    aspirin  EC 81 MG tablet Take 1 tablet (81 mg total) by mouth at bedtime. Start taking when you are [redacted] weeks pregnant  for rest of pregnancy for prevention of preeclampsia   Blood Pressure Kit Devi 1 Device by Does not apply route once a week.   ferrous sulfate 325 (65 FE) MG tablet Take 1 tablet (325 mg total) by mouth with breakfast on Monday, Wednesday, and Friday.   fluticasone 50 MCG/ACT nasal spray Commonly known as: FLONASE Place 1 spray into both nostrils daily as needed for allergies.   loratadine 10 MG tablet Commonly known as: CLARITIN Take 10 mg by mouth daily as needed for allergies.   ondansetron  8 MG disintegrating tablet Commonly known as: ZOFRAN -ODT Take 1 tablet (8 mg total) by mouth every 8 (eight) hours as needed for nausea or vomiting.   Vitafol  Gummies 3.33-0.333-34.8 MG Chew Chew 1 tablet by mouth daily.        Joesph DELENA Sear, PA

## 2024-04-20 NOTE — Discharge Instructions (Signed)

## 2024-04-20 NOTE — MAU Note (Signed)
 Brenda Hanson is a 18 y.o. at [redacted]w[redacted]d here in MAU reporting: contractions started 30 mins ago, cerclage taken out 2 days ago- not dilated. She reports +FMs, Denies LOF, VB, blurry vision, headaches, peripheral edema, or RUQ pain.   LMP: - Onset of complaint: 1500 Pain score: 6/10 Vitals:   04/20/24 1547  BP: (!) 121/90  Pulse: 99  Resp: 16  Temp: 98.2 F (36.8 C)  SpO2: 100%     FHT: 145  Lab orders placed from triage: none

## 2024-04-21 LAB — CULTURE, BETA STREP (GROUP B ONLY): Strep Gp B Culture: NEGATIVE

## 2024-04-24 ENCOUNTER — Ambulatory Visit: Admitting: Obstetrics and Gynecology

## 2024-04-24 ENCOUNTER — Encounter: Payer: Self-pay | Admitting: Obstetrics and Gynecology

## 2024-04-24 VITALS — BP 109/70 | HR 91 | Wt 158.0 lb

## 2024-04-24 DIAGNOSIS — O3432 Maternal care for cervical incompetence, second trimester: Secondary | ICD-10-CM

## 2024-04-24 DIAGNOSIS — Z3483 Encounter for supervision of other normal pregnancy, third trimester: Secondary | ICD-10-CM

## 2024-04-24 NOTE — Progress Notes (Addendum)
   PRENATAL VISIT NOTE  Subjective:  Brenda Hanson is a 18 y.o. G1P0000 at [redacted]w[redacted]d being seen today for ongoing prenatal care.  She is currently monitored for the following issues for this low-risk pregnancy and has Encounter for supervision of other normal pregnancy, third trimester; Chlamydia infection affecting pregnancy in second trimester, antepartum; Thalassemia alpha carrier; Cervical insufficiency during pregnancy in second trimester, antepartum; Late prenatal care affecting pregnancy in second trimester; and Anxiety on their problem list.  Patient reports no complaints.  Contractions: Irregular. Vag. Bleeding: None.  Movement: Present. Denies leaking of fluid.   The following portions of the patient's history were reviewed and updated as appropriate: allergies, current medications, past family history, past medical history, past social history, past surgical history and problem list.   Objective:   Vitals:   04/24/24 1353  BP: 109/70  Pulse: 91  Weight: 158 lb (71.7 kg)    Fetal Status:  Fetal Heart Rate (bpm): 148 Fundal Height: 37 cm Movement: Present    General: Alert, oriented and cooperative. Patient is in no acute distress.  Skin: Skin is warm and dry. No rash noted.   Cardiovascular: Normal heart rate noted  Respiratory: Normal respiratory effort, no problems with respiration noted  Abdomen: Soft, gravid, appropriate for gestational age.  Pain/Pressure: Absent     Pelvic: Cervical exam deferred        Extremities: Normal range of motion.  Edema: None  Mental Status: Normal mood and affect. Normal behavior. Normal judgment and thought content.      02/16/2024    9:19 AM 12/05/2023    8:47 AM  Depression screen PHQ 2/9  Decreased Interest 0 0  Down, Depressed, Hopeless 0 0  PHQ - 2 Score 0 0  Altered sleeping 0 0  Tired, decreased energy 3 0  Change in appetite 0 0  Feeling bad or failure about yourself  0 0  Trouble concentrating 0 0  Moving slowly or  fidgety/restless 0 0  Suicidal thoughts 0 0  PHQ-9 Score 3  0   Difficult doing work/chores Not difficult at all      Data saved with a previous flowsheet row definition        02/16/2024    9:20 AM 12/05/2023    8:47 AM  GAD 7 : Generalized Anxiety Score  Nervous, Anxious, on Edge 0 1  Control/stop worrying 0 0  Worry too much - different things 0 0  Trouble relaxing 0 0  Restless 0 0  Easily annoyed or irritable 3 3  Afraid - awful might happen 0 0  Total GAD 7 Score 3 4  Anxiety Difficulty Not difficult at all     Assessment and Plan:  Pregnancy: G1P0000 at [redacted]w[redacted]d 1. Encounter for supervision of other normal pregnancy, third trimester (Primary) Patient is doing well without complaints Patient is still searching pediatrician Plans depo-provera for contraception  2. Cervical insufficiency during pregnancy in second trimester, antepartum S/p cerclage removal  Term labor symptoms and general obstetric precautions including but not limited to vaginal bleeding, contractions, leaking of fluid and fetal movement were reviewed in detail with the patient. Please refer to After Visit Summary for other counseling recommendations.   Return in about 1 week (around 05/01/2024) for in person, ROB, Low risk.  Future Appointments  Date Time Provider Department Center  04/30/2024  2:50 PM Zina Jerilynn LABOR, MD [facility] None    Winton Felt, MD

## 2024-04-28 ENCOUNTER — Inpatient Hospital Stay (HOSPITAL_COMMUNITY): Admitting: Anesthesiology

## 2024-04-28 ENCOUNTER — Inpatient Hospital Stay (HOSPITAL_COMMUNITY)
Admission: AD | Admit: 2024-04-28 | Discharge: 2024-04-30 | DRG: 807 | Disposition: A | Payer: Self-pay | Attending: Obstetrics and Gynecology | Admitting: Obstetrics and Gynecology

## 2024-04-28 ENCOUNTER — Encounter: Payer: Self-pay | Admitting: Advanced Practice Midwife

## 2024-04-28 ENCOUNTER — Other Ambulatory Visit: Payer: Self-pay

## 2024-04-28 ENCOUNTER — Encounter (HOSPITAL_COMMUNITY): Payer: Self-pay | Admitting: Obstetrics and Gynecology

## 2024-04-28 DIAGNOSIS — A749 Chlamydial infection, unspecified: Secondary | ICD-10-CM | POA: Diagnosis present

## 2024-04-28 DIAGNOSIS — O4202 Full-term premature rupture of membranes, onset of labor within 24 hours of rupture: Secondary | ICD-10-CM | POA: Diagnosis not present

## 2024-04-28 DIAGNOSIS — O09613 Supervision of young primigravida, third trimester: Secondary | ICD-10-CM | POA: Diagnosis not present

## 2024-04-28 DIAGNOSIS — O3432 Maternal care for cervical incompetence, second trimester: Secondary | ICD-10-CM | POA: Diagnosis present

## 2024-04-28 DIAGNOSIS — O99344 Other mental disorders complicating childbirth: Secondary | ICD-10-CM | POA: Diagnosis not present

## 2024-04-28 DIAGNOSIS — F419 Anxiety disorder, unspecified: Secondary | ICD-10-CM | POA: Diagnosis present

## 2024-04-28 DIAGNOSIS — D563 Thalassemia minor: Secondary | ICD-10-CM | POA: Diagnosis present

## 2024-04-28 DIAGNOSIS — Z3A37 37 weeks gestation of pregnancy: Secondary | ICD-10-CM

## 2024-04-28 LAB — CBC
HCT: 27.7 % — ABNORMAL LOW (ref 36.0–46.0)
Hemoglobin: 9 g/dL — ABNORMAL LOW (ref 12.0–15.0)
MCH: 22 pg — ABNORMAL LOW (ref 26.0–34.0)
MCHC: 32.5 g/dL (ref 30.0–36.0)
MCV: 67.6 fL — ABNORMAL LOW (ref 80.0–100.0)
Platelets: 245 K/uL (ref 150–400)
RBC: 4.1 MIL/uL (ref 3.87–5.11)
RDW: 15 % (ref 11.5–15.5)
WBC: 11 K/uL — ABNORMAL HIGH (ref 4.0–10.5)
nRBC: 0 % (ref 0.0–0.2)

## 2024-04-28 LAB — POCT FERN TEST: POCT Fern Test: POSITIVE

## 2024-04-28 LAB — TYPE AND SCREEN
ABO/RH(D): A POS
Antibody Screen: NEGATIVE

## 2024-04-28 MED ORDER — BUPIVACAINE HCL (PF) 0.25 % IJ SOLN
INTRAMUSCULAR | Status: DC | PRN
  Administered 2024-04-28: 1.2 mL via INTRATHECAL

## 2024-04-28 MED ORDER — OXYTOCIN-SODIUM CHLORIDE 30-0.9 UT/500ML-% IV SOLN
2.5000 [IU]/h | INTRAVENOUS | Status: DC
  Administered 2024-04-28: 2.5 [IU]/h via INTRAVENOUS
  Filled 2024-04-28: qty 500

## 2024-04-28 MED ORDER — EPHEDRINE 5 MG/ML INJ: 10.0000 mg | INTRAVENOUS | Status: DC | PRN

## 2024-04-28 MED ORDER — LACTATED RINGERS IV SOLN: INTRAVENOUS | Status: DC

## 2024-04-28 MED ORDER — SENNOSIDES-DOCUSATE SODIUM 8.6-50 MG PO TABS
2.0000 | ORAL_TABLET | Freq: Every day | ORAL | Status: DC
  Administered 2024-04-29 – 2024-04-30 (×2): 2 via ORAL
  Filled 2024-04-28 (×2): qty 2

## 2024-04-28 MED ORDER — ONDANSETRON HCL 4 MG PO TABS
4.0000 mg | ORAL_TABLET | ORAL | Status: DC | PRN
  Filled 2024-04-28: qty 1

## 2024-04-28 MED ORDER — WITCH HAZEL-GLYCERIN EX PADS: 1.0000 | MEDICATED_PAD | CUTANEOUS | Status: DC | PRN

## 2024-04-28 MED ORDER — LACTATED RINGERS IV SOLN: 500.0000 mL | Freq: Once | INTRAVENOUS | Status: DC

## 2024-04-28 MED ORDER — COCONUT OIL OIL: 1.0000 | TOPICAL_OIL | Status: DC | PRN

## 2024-04-28 MED ORDER — TETANUS-DIPHTH-ACELL PERTUSSIS 5-2-15.5 LF-MCG/0.5 IM SUSP: 0.5000 mL | Freq: Once | INTRAMUSCULAR | Status: DC

## 2024-04-28 MED ORDER — OXYTOCIN BOLUS FROM INFUSION
333.0000 mL | Freq: Once | INTRAVENOUS | Status: AC
  Administered 2024-04-28: 333 mL via INTRAVENOUS

## 2024-04-28 MED ORDER — ACETAMINOPHEN 325 MG PO TABS: 650.0000 mg | ORAL_TABLET | ORAL | Status: DC | PRN

## 2024-04-28 MED ORDER — FLEET ENEMA RE ENEM: 1.0000 | ENEMA | RECTAL | Status: DC | PRN

## 2024-04-28 MED ORDER — FENTANYL CITRATE (PF) 100 MCG/2ML IJ SOLN
INTRAMUSCULAR | Status: AC
  Filled 2024-04-28: qty 2

## 2024-04-28 MED ORDER — OXYCODONE-ACETAMINOPHEN 5-325 MG PO TABS: 1.0000 | ORAL_TABLET | ORAL | Status: DC | PRN

## 2024-04-28 MED ORDER — TRANEXAMIC ACID-NACL 1000-0.7 MG/100ML-% IV SOLN
INTRAVENOUS | Status: AC
  Filled 2024-04-28: qty 100

## 2024-04-28 MED ORDER — ONDANSETRON HCL 4 MG/2ML IJ SOLN
4.0000 mg | INTRAMUSCULAR | Status: DC | PRN
  Administered 2024-04-30: 4 mg via INTRAVENOUS
  Filled 2024-04-28: qty 2

## 2024-04-28 MED ORDER — LACTATED RINGERS IV SOLN: 500.0000 mL | INTRAVENOUS | Status: DC | PRN

## 2024-04-28 MED ORDER — LIDOCAINE HCL (PF) 1 % IJ SOLN: 30.0000 mL | INTRAMUSCULAR | Status: DC | PRN

## 2024-04-28 MED ORDER — SOD CITRATE-CITRIC ACID 500-334 MG/5ML PO SOLN: 30.0000 mL | ORAL | Status: DC | PRN

## 2024-04-28 MED ORDER — PRENATAL MULTIVITAMIN CH
1.0000 | ORAL_TABLET | Freq: Every day | ORAL | Status: DC
  Administered 2024-04-29 – 2024-04-30 (×2): 1 via ORAL
  Filled 2024-04-28 (×2): qty 1

## 2024-04-28 MED ORDER — BENZOCAINE-MENTHOL 20-0.5 % EX AERO
1.0000 | INHALATION_SPRAY | CUTANEOUS | Status: DC | PRN
  Administered 2024-04-28: 1 via TOPICAL
  Filled 2024-04-28: qty 56

## 2024-04-28 MED ORDER — ONDANSETRON HCL 4 MG/2ML IJ SOLN
4.0000 mg | Freq: Four times a day (QID) | INTRAMUSCULAR | Status: DC | PRN
  Administered 2024-04-28: 4 mg via INTRAVENOUS
  Filled 2024-04-28: qty 2

## 2024-04-28 MED ORDER — DIBUCAINE (PERIANAL) 1 % EX OINT: 1.0000 | TOPICAL_OINTMENT | CUTANEOUS | Status: DC | PRN

## 2024-04-28 MED ORDER — TRANEXAMIC ACID-NACL 1000-0.7 MG/100ML-% IV SOLN
1000.0000 mg | INTRAVENOUS | Status: AC
  Administered 2024-04-28: 1000 mg via INTRAVENOUS

## 2024-04-28 MED ORDER — LIDOCAINE HCL (PF) 1 % IJ SOLN
INTRAMUSCULAR | Status: DC | PRN
  Administered 2024-04-28: 4 mL via EPIDURAL

## 2024-04-28 MED ORDER — IBUPROFEN 600 MG PO TABS
600.0000 mg | ORAL_TABLET | Freq: Four times a day (QID) | ORAL | Status: DC
  Administered 2024-04-28 – 2024-04-30 (×7): 600 mg via ORAL
  Filled 2024-04-28 (×8): qty 1

## 2024-04-28 MED ORDER — PHENYLEPHRINE 80 MCG/ML (10ML) SYRINGE FOR IV PUSH (FOR BLOOD PRESSURE SUPPORT)
80.0000 ug | PREFILLED_SYRINGE | INTRAVENOUS | Status: DC | PRN
  Filled 2024-04-28: qty 10

## 2024-04-28 MED ORDER — FENTANYL CITRATE (PF) 100 MCG/2ML IJ SOLN: 100.0000 ug | Freq: Once | INTRAMUSCULAR | Status: DC

## 2024-04-28 MED ORDER — DIPHENHYDRAMINE HCL 50 MG/ML IJ SOLN: 12.5000 mg | INTRAMUSCULAR | Status: DC | PRN

## 2024-04-28 MED ORDER — PHENYLEPHRINE 80 MCG/ML (10ML) SYRINGE FOR IV PUSH (FOR BLOOD PRESSURE SUPPORT): 80.0000 ug | PREFILLED_SYRINGE | INTRAVENOUS | Status: DC | PRN

## 2024-04-28 MED ORDER — FENTANYL-BUPIVACAINE-NACL 0.5-0.125-0.9 MG/250ML-% EP SOLN
12.0000 mL/h | EPIDURAL | Status: DC | PRN
  Administered 2024-04-28: 12 mL/h via EPIDURAL
  Filled 2024-04-28: qty 250

## 2024-04-28 MED ORDER — DIPHENHYDRAMINE HCL 25 MG PO CAPS
25.0000 mg | ORAL_CAPSULE | Freq: Four times a day (QID) | ORAL | Status: DC | PRN
  Administered 2024-04-30: 25 mg via ORAL
  Filled 2024-04-28: qty 1

## 2024-04-28 MED ORDER — SIMETHICONE 80 MG PO CHEW: 80.0000 mg | CHEWABLE_TABLET | ORAL | Status: DC | PRN

## 2024-04-28 MED ORDER — OXYCODONE-ACETAMINOPHEN 5-325 MG PO TABS: 2.0000 | ORAL_TABLET | ORAL | Status: DC | PRN

## 2024-04-28 NOTE — Anesthesia Procedure Notes (Signed)
 Combined Spinal Epidural Patient location during procedure: OB Start time: 04/28/2024 11:48 AM End time: 04/28/2024 11:51 AM  Staffing Anesthesiologist: Paul Lamarr BRAVO, MD Performed: anesthesiologist   Preanesthetic Checklist Completed: patient identified, IV checked, risks and benefits discussed, monitors and equipment checked, pre-op evaluation and timeout performed  Epidural Patient position: sitting Prep: DuraPrep and site prepped and draped Patient monitoring: heart rate, continuous pulse ox and blood pressure Approach: midline Location: L3-L4 Injection technique: LOR air  Needle:  Needle type: Whitacre  Needle gauge: 25 G Needle length: 12.7 cm Needle type: Tuohy  Needle gauge: 17 G Needle length: 9 cm Needle insertion depth: 5 cm Catheter type: closed end flexible Catheter size: 19 Gauge Catheter at skin depth: 10 cm  Assessment Events: blood not aspirated, injection not painful, no injection resistance, no paresthesia and negative IV test  Additional Notes Patient identified. Risks, benefits, and alternatives discussed with patient including but not limited to bleeding, infection, nerve damage, paralysis, failed block, incomplete pain control, headache, blood pressure changes, nausea, vomiting, reactions to medication, itching, and postpartum back pain. Confirmed with bedside nurse the patient's most recent platelet count. Confirmed with patient that they are not currently taking any anticoagulation, have any bleeding history, or any family history of bleeding disorders. Patient expressed understanding and wished to proceed. All questions were answered. Sterile technique was used throughout the entire procedure. Please see nursing notes for vital signs.   Crisp LOR with Tuohy needle on first pass. Whitacre 25g spinal needle introduced through Tuohy with clear CSF return prior to injection of intrathecal medication. Spinal needle withdrawn and epidural catheter  threaded easily. Negative aspiration of catheter for heme or CSF prior to starting epidural infusion. Warning signs of high block given to the patient including shortness of breath, tingling/numbness in hands, complete motor block, or any concerning symptoms with instructions to call for help. Patient was given instructions on fall risk and not to get out of bed. All questions and concerns addressed with instructions to call with any issues or inadequate analgesia.  Patient comfortable with contractions prior to my leaving room.  Reason for block:procedure for pain

## 2024-04-28 NOTE — Discharge Summary (Signed)
 Postpartum Discharge Summary  Date of Service updated***     Patient Name: Brenda Hanson DOB: 08/28/2005 MRN: [id number]  Date of admission: 04/28/2024 Delivery date:04/28/2024 Delivering provider: MILLY PLANAS A Date of discharge: 04/28/2024  Admitting diagnosis: Normal labor [O80, Z37.9] Intrauterine pregnancy: [redacted]w[redacted]d     Secondary diagnosis:  Principal Problem:   Normal labor  Additional problems: ***    Discharge diagnosis: Term Pregnancy Delivered                                              Post partum procedures:*** Augmentation: N/A Complications: None  Hospital course: Onset of Labor With Vaginal Delivery      18 y.o. yo G1P0000 at [redacted]w[redacted]d was admitted in Latent Labor on 04/28/2024. Labor course was complicated by none  Membrane Rupture Time/Date: 8:00 AM,04/28/2024  Delivery Method:Vaginal, Spontaneous Operative Delivery:N/A Episiotomy: None Lacerations:    Patient had a postpartum course complicated by ***.  She is ambulating, tolerating a regular diet, passing flatus, and urinating well. Patient is discharged home in stable condition on 04/28/24.  Newborn Data: Birth date:04/28/2024 Birth time:6:57 PM Gender:Female Living status:Living Apgars:8 ,9  Weight:   Magnesium Sulfate received: No BMZ received: No Rhophylac:N/A MMR:N/A T-DaP:Given prenatally Flu: Yes RSV Vaccine received: {RSV:31013} Transfusion:{Transfusion received:30440034}  Immunizations received: Immunization History  Administered Date(s) Administered   DTaP / Hep B / IPV 10/23/2004   DTaP / IPV 09/16/2008   Dtap, Unspecified 12/24/2004, 02/19/2005, 11/17/2005   HIB, Unspecified 10/23/2004, 12/24/2004, 08/16/2005, 11/17/2005   HPV 9-valent 08/27/2015, 06/22/2017   Hep A, Unspecified 08/16/2005   Hep B, Unspecified 07-09-04, 02/19/2005   Hepatitis A, Ped/Adol-2 Dose 02/18/2006   IPV 12/24/2004, 02/19/2005   Influenza, Seasonal, Injecte, Preservative Fre 06/03/2023    Influenza,inj,Quad PF,6+ Mos 06/22/2017   MMR 08/16/2005, 09/16/2008   MenQuadfi_Meningococcal Groups ACYW Conjugate 02/03/2022   Meningococcal Mcv4o 08/28/2015   Pneumococcal Conjugate PCV 7 10/23/2004, 12/24/2004, 02/19/2005, 08/16/2005   Tdap 08/27/2015, 02/16/2024   Varicella 08/16/2005, 09/16/2008    Physical exam  Vitals:   04/28/24 1631 04/28/24 1701 04/28/24 1731 04/28/24 1800  BP: 116/70 120/70 101/60 110/62  Pulse: 80 85 79 78  Resp:      Temp:      TempSrc:      SpO2:      Weight:      Height:       General: {Exam; general:21111117} Lochia: {Desc; appropriate/inappropriate:30686::appropriate} Uterine Fundus: {Desc; firm/soft:30687} Incision: {Exam; incision:21111123} DVT Evaluation: {Exam; dvt:2111122} Labs: Lab Results  Component Value Date   WBC 11.0 (H) 04/28/2024   HGB 9.0 (L) 04/28/2024   HCT 27.7 (L) 04/28/2024   MCV 67.6 (L) 04/28/2024   PLT 245 04/28/2024      Latest Ref Rng & Units 04/20/2024    4:06 PM  CMP  Glucose 70 - 99 mg/dL 90   BUN 6 - 20 mg/dL 7   Creatinine 9.55 - 8.99 mg/dL 9.46   Sodium 864 - 854 mmol/L 134   Potassium 3.5 - 5.1 mmol/L 3.5   Chloride 98 - 111 mmol/L 106   CO2 22 - 32 mmol/L 19   Calcium 8.9 - 10.3 mg/dL 8.3   Total Protein 6.5 - 8.1 g/dL 6.2   Total Bilirubin 0.0 - 1.2 mg/dL 0.3   Alkaline Phos 38 - 126 U/L 99   AST 15 - 41 U/L  20   ALT 0 - 44 U/L 13    Edinburgh Score:     No data to display         No data recorded  After visit meds:  Allergies as of 04/28/2024       Reactions   Pineapple Other (See Comments)   Tongue burning     Med Rec must be completed prior to using this [facility]***        Discharge home in stable condition Infant Feeding: {Baby feeding:23562} Infant Disposition:{CHL IP OB HOME WITH FNUYZM:76418} Discharge instruction: per After Visit Summary and Postpartum booklet. Activity: Advance as tolerated. Pelvic rest for 6 weeks.  Diet: {OB ipzu:78888878} Future  Appointments: Future Appointments  Date Time Provider Department Center  04/30/2024  2:50 PM Zina Jerilynn LABOR, MD [facility] None   Follow up Visit:  Message sent to [facility] by [facility], CNM 04/28/24 Please schedule this patient for a In person postpartum visit in 6 weeks with the following provider: Olam Boards, CNM. Additional Postpartum F/U:Postpartum Depression checkup  Low risk pregnancy complicated by: NA Delivery mode:  Vaginal, Spontaneous Anticipated Birth Control:  LAM   04/28/2024 Camie LABOR Rote, CNM

## 2024-04-28 NOTE — Progress Notes (Addendum)
 OBSTETRIC ADMISSION HISTORY AND PHYSICAL  Brenda Hanson is a 18 y.o. female G1P0000 with IUP at [redacted]w[redacted]d by US  presenting SOL with SROM since 12/6 at 0800. She reports +FMs, No LOF, no VB, no blurry vision, headaches or peripheral edema, and RUQ pain.          NURSING  PROVIDER  Office Location [facility] Dating by TBD by MFM- 17 wk FL on 7/14  [facility] Model Traditional Anatomy U/S normal  Initiated care at  [employer]  English               LAB RESULTS   Support Person  FOB/ MOTHER Genetics NIPS: LR  AFP:       NT/IT (FT only)        Carrier Screen Horizon: negative  Rhogam  --/--/A POS (08/29 1139) A1C/GTT Early HgbA1C: 4.8 Third trimester 2 hr GTT: normal  Flu Vaccine Yes      TDaP Vaccine  02/16/24 Blood Type --/--/A POS (08/29 1139)  RSV Vaccine   Antibody NEG (08/29 1139)  COVID Vaccine No Rubella 1.62 (07/14 0955)  Feeding Plan breast RPR Non Reactive (07/14 0955)  Contraception Depo-Provera HBsAg Negative (07/14 0955)  Circumcision Yes if female HIV Non Reactive (07/14 0955)  Pediatrician  Undecided HCVAb Non Reactive (07/14 0955)  Prenatal Classes        BTL Consent   Pap No results found for: DIAGPAP  BTL Pre-payment   GC/CT Initial:  chlamydia + 36wks:    VBAC Consent   GBS For PCN allergy, check sensitivities   BRx Optimized? [X]  yes   [ ]  no      DME Rx [X]  BP cuff [ ]  Weight Scale Waterbirth  [ ]  Class [ ]  Consent [ ]  CNM visit  PHQ9 & GAD7 [X]  new OB [X]  28 weeks  [  ] 36 weeks Induction  [ ]  Orders Entered [ ] Foley Y/N      Prenatal History/Complications: She has late prenatal care affecting pregnancy in second trimester; 7/15 pos chlamydia (TOC early October) and Anxiety on her problem list, but an otherwise uncomplicated course of pregnancy. She plans on breast and bottle feeding. She request Depo for birth control.  Past Medical History: Past Medical History:  Diagnosis Date   Medical history non-contributory     Past Surgical History: Past  Surgical History:  Procedure Laterality Date   CERVICAL CERCLAGE N/A 01/20/2024   Procedure: CERCLAGE, CERVIX, VAGINAL APPROACH;  Surgeon: William Glenn, DO;  Location: [facility] LD ORS;  Service: Obstetrics;  Laterality: N/A;   NO PAST SURGERIES      Obstetrical History: OB History     Gravida  1   Para  0   Term  0   Preterm  0   AB  0   Living  0      SAB  0   IAB  0   Ectopic  0   Multiple  0   Live Births  0           Social History Social History   Socioeconomic History   Marital status: Single    Spouse name: Not on file   Number of children: Not on file   Years of education: Not on file   Highest education level: Not on file  Occupational History   Not on file  Tobacco Use   Smoking status: Never  Smokeless tobacco: Never  Vaping Use   Vaping status: Former   Substances: Nicotine, Flavoring  Substance and Sexual Activity   Alcohol use: Never   Drug use: Never   Sexual activity: Yes  Other Topics Concern   Not on file  Social History Narrative   Not on file   Social Drivers of Health   Financial Resource Strain: Not on File (09/10/2021)   Received from [employer]    Financial Resource Strain: 0  Food Insecurity: No Food Insecurity (04/28/2024)   Hunger Vital Sign    Worried About Running Out of Food in the Last Year: Never true    Ran Out of Food in the Last Year: Never true  Transportation Needs: No Transportation Needs (04/28/2024)   PRAPARE - Administrator, Civil Service (Medical): No    Lack of Transportation (Non-Medical): No  Physical Activity: Not on File (09/10/2021)   Received from [facility]   Physical Activity    Physical Activity: 0  Stress: Not on File (09/10/2021)   Received from [facility]   Stress    Stress: 0  Social Connections: Not on File (02/05/2023)   Received from [facility]   Social Connections    Connectedness: 0    Family History: Family History  Problem Relation Age of Onset    Heart disease Mother        fluid around heart   Healthy Father     Allergies: Allergies  Allergen Reactions   Pineapple Other (See Comments)    Tongue burning    Medications Prior to Admission  Medication Sig Dispense Refill Last Dose/Taking   aspirin  EC 81 MG tablet Take 1 tablet (81 mg total) by mouth at bedtime. Start taking when you are [redacted] weeks pregnant for rest of pregnancy for prevention of preeclampsia 300 tablet 2 04/28/2024   ferrous sulfate  325 (65 FE) MG tablet Take 1 tablet (325 mg total) by mouth with breakfast on Monday, Wednesday, and Friday. 30 tablet 11 Past Week   Prenatal Vit-Fe Phos-FA-Omega (VITAFOL  GUMMIES) 3.33-0.333-34.8 MG CHEW Chew 1 tablet by mouth daily. 90 tablet 5 04/28/2024   Blood Pressure Monitoring (BLOOD PRESSURE KIT) DEVI 1 Device by Does not apply route once a week. 1 each 0    fluticasone (FLONASE) 50 MCG/ACT nasal spray Place 1 spray into both nostrils daily as needed for allergies. (Patient not taking: Reported on 04/04/2024)      loratadine (CLARITIN) 10 MG tablet Take 10 mg by mouth daily as needed for allergies. (Patient not taking: Reported on 04/04/2024)      ondansetron  (ZOFRAN -ODT) 8 MG disintegrating tablet Take 1 tablet (8 mg total) by mouth every 8 (eight) hours as needed for nausea or vomiting. 20 tablet 0 Unknown     Review of Systems   All systems reviewed and negative except as stated in HPI  Blood pressure 125/75, pulse 83, resp. rate (!) 22, height 5' 2 (1.575 m), weight 71.7 kg, last menstrual period 09/02/2023, SpO2 100%. General appearance: alert and cooperative Lungs: clear, unlabored, normal respirations Heart: regular rate  Abdomen: soft, non-tender; bowel sounds normal Pelvic: Deferred Extremities: Homans sign is negative, no sign of DVT Fetal monitoringBaseline: 120 bpm, Variability: Good {> 6 bpm), Accelerations: Reactive, and Decelerations: Absent Uterine activity Frequency: Regular q 1-3 minutes and Intensity:  moderate Dilation: 6 Effacement (%): 90 Station: 0 Exam by:: Deel RN   Prenatal labs: ABO, Rh: --/--/A POS (12/06 1117) Antibody: NEG (12/06 1117)  Rubella: 1.62 (07/14 0955) RPR: Non Reactive (07/14 0955)  HBsAg: Negative (07/14 0955)  HIV: Non Reactive (07/14 0955)  GBS: Negative/-- (11/25 0405)    Lab Results  Component Value Date   GBS Negative 04/17/2024     Immunization History  Administered Date(s) Administered   DTaP / Hep B / IPV 10/23/2004   DTaP / IPV 09/16/2008   Dtap, Unspecified 12/24/2004, 02/19/2005, 11/17/2005   HIB, Unspecified 10/23/2004, 12/24/2004, 08/16/2005, 11/17/2005   HPV 9-valent 08/27/2015, 06/22/2017   Hep A, Unspecified 08/16/2005   Hep B, Unspecified 12-22-2004, 02/19/2005   Hepatitis A, Ped/Adol-2 Dose 02/18/2006   IPV 12/24/2004, 02/19/2005   Influenza, Seasonal, Injecte, Preservative Fre 06/03/2023   Influenza,inj,Quad PF,6+ Mos 06/22/2017   MMR 08/16/2005, 09/16/2008   MenQuadfi_Meningococcal Groups ACYW Conjugate 02/03/2022   Meningococcal Mcv4o 08/28/2015   Pneumococcal Conjugate PCV 7 10/23/2004, 12/24/2004, 02/19/2005, 08/16/2005   Tdap 08/27/2015, 02/16/2024   Varicella 08/16/2005, 09/16/2008    Prenatal Transfer Tool  Maternal Diabetes: No Genetic Screening: Normal Maternal Ultrasounds/Referrals: Normal Fetal Ultrasounds or other Referrals:  None Maternal Substance Abuse:  No Significant Maternal Medications:  None Significant Maternal Lab Results: Group B Strep negative Number of Prenatal Visits:greater than 3 verified prenatal visits Maternal Vaccinations:TDap and Flu Other Comments:  None   Results for orders placed or performed during the hospital encounter of 04/28/24 (from the past 24 hours)  Fern Test   Collection Time: 04/28/24 11:00 AM  Result Value Ref Range   POCT Fern Test Positive = ruptured amniotic membanes   Type and screen [facility]   Collection Time: 04/28/24 11:17 AM  Result  Value Ref Range   ABO/RH(D) A POS    Antibody Screen NEG    Sample Expiration      05/01/2024,2359 Performed at [facility], [address] [place], [place] [address]   CBC   Collection Time: 04/28/24 11:18 AM  Result Value Ref Range   WBC 11.0 (H) 4.0 - 10.5 K/uL   RBC 4.10 3.87 - 5.11 MIL/uL   Hemoglobin 9.0 (L) 12.0 - 15.0 g/dL   HCT 72.2 (L) 63.9 - 53.9 %   MCV 67.6 (L) 80.0 - 100.0 fL   MCH 22.0 (L) 26.0 - 34.0 pg   MCHC 32.5 30.0 - 36.0 g/dL   RDW 84.9 88.4 - 84.4 %   Platelets 245 150 - 400 K/uL   nRBC 0.0 0.0 - 0.2 %    Patient Active Problem List   Diagnosis Date Noted   Normal labor 04/28/2024   Anxiety 01/20/2024   Cervical insufficiency during pregnancy in second trimester, antepartum 01/18/2024   Late prenatal care affecting pregnancy in second trimester 01/18/2024   Thalassemia alpha carrier 12/19/2023   Chlamydia infection affecting pregnancy in second trimester, antepartum 12/06/2023   Encounter for supervision of other normal pregnancy, third trimester 12/05/2023    Assessment/Plan:  Brenda Hanson is a 18 y.o. G1P0000 at [redacted]w[redacted]d here for SROM at 04/28/24 with clear fluid. She arrived to L&D SVE 6 CM, with contractions 1-3 minutes apart. She has just received epidural, and is now comfortable and resting. Discussed plan to continue expectant management, patient agreeable to plan.  #Labor: Progressing well, expectant management #Pain: Epidural #FWB: Category 1 with moderate variability, 15x15 accelerations, and absent decelerations #GBS status:  negative #Feeding: Breastmilk  and Formula #Reproductive Life planning: Depo Provera #Circ:  not applicable  [name], Student-MidWife  04/28/2024, 1:50 PM   Midwife Attestation:  I personally  saw and evaluated the patient, performing the key elements of the service. I developed and verified the management plan that is described in the resident's/student's note, and I agree with the content with my  edits above. VSS, HRR&R, Resp unlabored, Legs neg.    Olam Boards, CNM 5:49 PM

## 2024-04-28 NOTE — Anesthesia Preprocedure Evaluation (Signed)
 Anesthesia Evaluation  Patient identified by MRN, date of birth, ID band Patient awake    Reviewed: Allergy & Precautions, Patient's Chart, lab work & pertinent test results  History of Anesthesia Complications Negative for: history of anesthetic complications  Airway Mallampati: II  TM Distance: >3 FB Neck ROM: Full    Dental no notable dental hx.    Pulmonary neg pulmonary ROS   Pulmonary exam normal        Cardiovascular negative cardio ROS Normal cardiovascular exam     Neuro/Psych   Anxiety     negative neurological ROS     GI/Hepatic negative GI ROS, Neg liver ROS,,,  Endo/Other  negative endocrine ROS    Renal/GU negative Renal ROS     Musculoskeletal negative musculoskeletal ROS (+)    Abdominal   Peds  Hematology  (+) Blood dyscrasia (Hgb 9.0), anemia   Anesthesia Other Findings   Reproductive/Obstetrics (+) Pregnancy                              Anesthesia Physical Anesthesia Plan  ASA: 2  Anesthesia Plan: Epidural   Post-op Pain Management:    Induction:   PONV Risk Score and Plan: Treatment may vary due to age or medical condition  Airway Management Planned: Natural Airway  Additional Equipment: Fetal Monitoring  Intra-op Plan:   Post-operative Plan:   Informed Consent: I have reviewed the patients History and Physical, chart, labs and discussed the procedure including the risks, benefits and alternatives for the proposed anesthesia with the patient or authorized representative who has indicated his/her understanding and acceptance.       Plan Discussed with:   Anesthesia Plan Comments:         Anesthesia Quick Evaluation

## 2024-04-28 NOTE — Progress Notes (Signed)
 Brenda Hanson is a 18 y.o. G1P0000 at [redacted]w[redacted]d by ultrasound admitted for rupture of membranes  Subjective: Patient comfortable with epidural.   Objective: BP 122/74   Pulse 83   Temp 98 F (36.7 C) (Oral)   Resp (!) 22   Ht 5' 2 (1.575 m)   Wt 71.7 kg   LMP 09/02/2023 (Approximate)   SpO2 100%   BMI 28.90 kg/m  No intake/output data recorded. No intake/output data recorded.  FHT:  FHR: 120s bpm, variability: moderate,  accelerations:  Present,  decelerations:  Absent UC:   regular, every 1-3 minutes SVE:   Dilation: 6.5 Effacement (%): 90 Station: 0 Exam by:: Brenda Hanson Brenda Hanson Brenda Hanson  Labs: Lab Results  Component Value Date   WBC 11.0 (H) 04/28/2024   HGB 9.0 (L) 04/28/2024   HCT 27.7 (L) 04/28/2024   MCV 67.6 (L) 04/28/2024   PLT 245 04/28/2024    Assessment / Plan: Labor: Spontaneous labor, progressing normally. Brenda Hanson and Brenda Hanson to bedside to perform cervical exam, chaperoned by RN. SVE by Brenda Hanson 6.5/90/0. Will plan to continue with expectant management. Fetal Wellbeing:  Category I Pain Control:  Epidural I/D:  GBS Negative Anticipated MOD:  NSVD  Brenda Hanson, Student-MidWife 04/28/2024, 2:53 PM

## 2024-04-29 LAB — SYPHILIS: RPR W/REFLEX TO RPR TITER AND TREPONEMAL ANTIBODIES, TRADITIONAL SCREENING AND DIAGNOSIS ALGORITHM: RPR Ser Ql: NONREACTIVE

## 2024-04-29 LAB — CBC
HCT: 22.5 % — ABNORMAL LOW (ref 36.0–46.0)
Hemoglobin: 7.3 g/dL — ABNORMAL LOW (ref 12.0–15.0)
MCH: 21.9 pg — ABNORMAL LOW (ref 26.0–34.0)
MCHC: 32.4 g/dL (ref 30.0–36.0)
MCV: 67.6 fL — ABNORMAL LOW (ref 80.0–100.0)
Platelets: 199 K/uL (ref 150–400)
RBC: 3.33 MIL/uL — ABNORMAL LOW (ref 3.87–5.11)
RDW: 15 % (ref 11.5–15.5)
WBC: 17.4 K/uL — ABNORMAL HIGH (ref 4.0–10.5)
nRBC: 0 % (ref 0.0–0.2)

## 2024-04-29 MED ORDER — DIPHENHYDRAMINE HCL 50 MG/ML IJ SOLN: 25.0000 mg | Freq: Once | INTRAMUSCULAR | Status: DC | PRN

## 2024-04-29 MED ORDER — SODIUM CHLORIDE 0.9 % IV BOLUS: 500.0000 mL | Freq: Once | INTRAVENOUS | Status: DC | PRN

## 2024-04-29 MED ORDER — ALBUTEROL SULFATE (2.5 MG/3ML) 0.083% IN NEBU: 2.5000 mg | INHALATION_SOLUTION | Freq: Once | RESPIRATORY_TRACT | Status: DC | PRN

## 2024-04-29 MED ORDER — METHYLPREDNISOLONE SODIUM SUCC 125 MG IJ SOLR: 125.0000 mg | Freq: Once | INTRAMUSCULAR | Status: DC | PRN

## 2024-04-29 MED ORDER — IRON SUCROSE 500 MG IVPB - SIMPLE MED
500.0000 mg | Freq: Once | INTRAVENOUS | Status: DC
  Filled 2024-04-29: qty 275

## 2024-04-29 MED ORDER — SODIUM CHLORIDE 0.9 % IV SOLN
500.0000 mg | Freq: Once | INTRAVENOUS | Status: AC
  Administered 2024-04-29: 500 mg via INTRAVENOUS
  Filled 2024-04-29: qty 25

## 2024-04-29 MED ORDER — EPINEPHRINE 0.3 MG/0.3ML IJ SOAJ: 0.3000 mg | Freq: Once | INTRAMUSCULAR | Status: DC | PRN

## 2024-04-29 MED ORDER — SODIUM CHLORIDE 0.9 % IV SOLN: INTRAVENOUS | Status: DC | PRN

## 2024-04-29 NOTE — Progress Notes (Addendum)
 Seen at bedside due to Hgb 7.3  Subjective:  Brenda Hanson is a 18 y.o. G1P1001 s/p SVD at [redacted]w[redacted]d. Pt denies problems with ambulating, voiding or po intake.  Pain is well controlled.  She has had flatus. She has not had bowel movement.  Lochia Minimal. Denies HA, dizziness.   Objective: Blood pressure 124/74, pulse 87, temperature (!) 97.5 F (36.4 C), temperature source Oral, resp. rate 17, height 5' 2 (1.575 m), weight 71.7 kg, last menstrual period 09/02/2023, SpO2 (P) 100%, unknown if currently breastfeeding.  Physical Exam:  General: alert, cooperative and no distress Chest: no respiratory distress Abdomen: soft, nontender Uterine Fundus: firm, appropriately tender Skin: warm, dry  Recent Labs    04/28/24 1118 04/29/24 0509  HGB 9.0* 7.3*  HCT 27.7* 22.5*    Assessment/Plan: Brenda Hanson is a 18 y.o. G1P1001 s/p SVD at [redacted]w[redacted]d   PPD#1 - Doing well #Anemia: Hgb 9.0 > 7.3. Well-appearing on exam without tachycardia. Denies HA, dizziness. States lochia is minimal. No concern for acute causes of bleeding. Discussed pRBC vs IV iron  vs PO iron . Given patient is asymptomatic, will proceed with iron  infusion.    LOS: 1 day   Bhagat, Virali, DO 04/29/2024, 9:11 PM   Attestation of Supervision of Resident: Evaluation and management procedures were performed by the learners: Family Medicine Resident under my supervision. I was immediately available for direct supervision, assistance and direction throughout this encounter.  I also confirm that I have verified the information documented in the resident's note, and that I have also personally reperformed the pertinent components of the physical exam and all of the medical decision making activities.  I have also made any necessary editorial changes.  Charlie DELENA Courts, MD FM-OB Fellow Center for [facility]

## 2024-04-29 NOTE — Anesthesia Postprocedure Evaluation (Signed)
 Anesthesia Post Note  Patient: Brenda Hanson  Procedure(s) Performed: AN AD HOC LABOR EPIDURAL     Patient location during evaluation: Mother Baby Anesthesia Type: Epidural Level of consciousness: awake and alert Pain management: pain level controlled Vital Signs Assessment: post-procedure vital signs reviewed and stable Respiratory status: spontaneous breathing, nonlabored ventilation and respiratory function stable Cardiovascular status: stable Postop Assessment: no headache, no backache, epidural receding, no apparent nausea or vomiting, patient able to bend at knees, able to ambulate and adequate PO intake Anesthetic complications: no   No notable events documented.  Last Vitals:  Vitals:   04/29/24 0337 04/29/24 0856  BP: (P) 108/62 116/70  Pulse: (P) 90 77  Resp: (P) 18 18  Temp: (P) 36.7 C 36.7 C  SpO2: (P) 100%     Last Pain:  Vitals:   04/29/24 0856  TempSrc: Oral  PainSc:    Pain Goal:                   [place] Sponsel Hristova

## 2024-04-29 NOTE — Progress Notes (Signed)
 Post Partum Day 1 Subjective: no complaints, up ad lib, voiding, and tolerating PO  Objective: Blood pressure 116/70, pulse 77, temperature 98.1 F (36.7 C), temperature source Oral, resp. rate 18, height 5' 2 (1.575 m), weight 71.7 kg, last menstrual period 09/02/2023, SpO2 (P) 100%, unknown if currently breastfeeding.  Physical Exam:  General: alert, cooperative, and no distress Lochia: appropriate Uterine Fundus: firm Incision: healing well DVT Evaluation: No evidence of DVT seen on physical exam.  Recent Labs    04/28/24 1118 04/29/24 0509  HGB 9.0* 7.3*  HCT 27.7* 22.5*    Assessment/Plan: Plan for discharge tomorrow and Breastfeeding   LOS: 1 day   Olam Boards, CNM 04/29/2024, 9:35 AM

## 2024-04-29 NOTE — H&P (Addendum)
 Brenda Hanson is a 18 y.o. female G1P1001  at [redacted]w[redacted]d presenting for active labor at term.   NURSING  PROVIDER  Office Location [facility] Dating by TBD by MFM- 17 wk FL on 7/14  [facility] Model Traditional Anatomy U/S normal  Initiated care at  [employer]  English              LAB RESULTS   Support Person  FOB/ MOTHER Genetics NIPS: LR AFP:     NT/IT (FT only)     Carrier Screen Horizon:   Rhogam  --/--/A POS (08/29 1139) A1C/GTT Early HgbA1C: 4.8 Third trimester 2 hr GTT: normal  Flu Vaccine Yes    TDaP Vaccine  02/16/24 Blood Type --/--/A POS (08/29 1139)  RSV Vaccine  Antibody NEG (08/29 1139)  COVID Vaccine No Rubella 1.62 (07/14 0955)  Feeding Plan breast RPR Non Reactive (07/14 0955)  Contraception Depo-Provera HBsAg Negative (07/14 0955)  Circumcision Yes if female HIV Non Reactive (07/14 0955)  Pediatrician  Undecided HCVAb Non Reactive (07/14 0955)  Prenatal Classes     BTL Consent  Pap No results found for: DIAGPAP  BTL Pre-payment  GC/CT Initial:  chlamydia + 36wks:    VBAC Consent  GBS   For PCN allergy, check sensitivities   BRx Optimized? [X]  yes   [ ]  no    DME Rx [X]  BP cuff [ ]  Weight Scale Waterbirth  [ ]  Class [ ]  Consent [ ]  CNM visit  PHQ9 & GAD7 [X]  new OB [X]  28 weeks  [  ] 36 weeks Induction  [ ]  Orders Entered [ ] Foley Y/N    OB History     Gravida  1   Para  1   Term  1   Preterm  0   AB  0   Living  1      SAB  0   IAB  0   Ectopic  0   Multiple  0   Live Births  1          Past Medical History:  Diagnosis Date   Medical history non-contributory    Past Surgical History:  Procedure Laterality Date   CERVICAL CERCLAGE N/A 01/20/2024   Procedure: CERCLAGE, CERVIX, VAGINAL APPROACH;  Surgeon: William Glenn, DO;  Location: [facility] LD ORS;  Service: Obstetrics;  Laterality: N/A;   NO PAST SURGERIES     Family History: family history includes Healthy in her father; Heart disease in her mother. Social History:  reports  that she has never smoked. She has never used smokeless tobacco. She reports that she does not drink alcohol and does not use drugs.     Maternal Diabetes: No Genetic Screening: Normal Maternal Ultrasounds/Referrals: Normal Fetal Ultrasounds or other Referrals:  None Maternal Substance Abuse:  No Significant Maternal Medications:  None Significant Maternal Lab Results:  Group B Strep negative Number of Prenatal Visits:greater than 3 verified prenatal visits Maternal Vaccinations:TDap Other Comments:  None  Review of Systems  Constitutional:  Negative for chills, fatigue and fever.  Eyes:  Negative for visual disturbance.  Respiratory:  Negative for shortness of breath.   Cardiovascular:  Negative for chest pain.  Gastrointestinal:  Positive for abdominal pain. Negative for vomiting.  Genitourinary:  Negative for difficulty urinating, dysuria, flank pain, pelvic pain, vaginal bleeding, vaginal discharge and vaginal pain.  Musculoskeletal:  Positive for back pain.  Neurological:  Negative  for dizziness and headaches.  Psychiatric/Behavioral: Negative.     Maternal Medical History:  Reason for admission: Rupture of membranes and contractions.   Contractions: Onset was 3-5 hours ago.   Frequency: irregular.   Perceived severity is mild.   Fetal activity: Perceived fetal activity is normal.   Prenatal complications: no prenatal complications Short cervix with cerclage Prenatal Complications - Diabetes: none.   Dilation: 10 Effacement (%): 100 Station: Plus 1, 0 Exam by:: Breanna SNM Blood pressure (P) 108/62, pulse (P) 90, temperature (P) 98.1 F (36.7 C), temperature source (P) Oral, resp. rate (P) 18, height 5' 2 (1.575 m), weight 71.7 kg, last menstrual period 09/02/2023, SpO2 (P) 100%, unknown if currently breastfeeding. Maternal Exam:  Uterine Assessment: Contraction strength is mild.  Abdomen: Patient reports no abdominal tenderness. Pelvis: adequate for delivery.      Fetal Exam Fetal Monitor Review: Mode: ultrasound.   Baseline rate: 135.  Variability: moderate (6-25 bpm).   Pattern: accelerations present.   Fetal State Assessment: Category I - tracings are normal.   Physical Exam Vitals and nursing note reviewed.  Constitutional:      Appearance: She is well-developed.  Cardiovascular:     Rate and Rhythm: Normal rate.  Pulmonary:     Effort: Pulmonary effort is normal.  Abdominal:     General: There is abdominal bruit.     Palpations: Abdomen is soft.  Musculoskeletal:        General: Normal range of motion.     Cervical back: Normal range of motion.  Skin:    General: Skin is warm and dry.  Neurological:     Mental Status: She is alert and oriented to person, place, and time.  Psychiatric:        Behavior: Behavior normal.        Thought Content: Thought content normal.        Judgment: Judgment normal.     Prenatal labs: ABO, Rh: --/--/A POS (12/06 1117) Antibody: NEG (12/06 1117) Rubella: 1.62 (07/14 0955) RPR: Non Reactive (07/14 0955)  HBsAg: Negative (07/14 0955)  HIV: Non Reactive (07/14 0955)  GBS: Negative/-- (11/25 0405)   Assessment/Plan: G1P1001  at [redacted]w[redacted]d  admitted for active labor GBS negative  Admit to L&D Expectant manage, Augment PRN Anticipate NSVD Pt plans to breastfeed and desires Depo inpatient for contraception.    Olam Boards 04/29/2024, 7:59 AM    Attestation of Attending Supervision of Advanced Practitioner (PA/CNM/NP): Evaluation, management, and procedures were performed by the Advanced Practitioner under my supervision and collaboration.  I have reviewed the Advanced Practitioner's note and chart, and I agree with the management and plan.  LOIS Yolanda Moats, MD, [facility] Attending Center for [employer] (Faculty Practice)  04/29/2024 1:37 PM

## 2024-04-29 NOTE — Lactation Note (Signed)
 This note was copied from a baby's chart. Lactation Consultation Note  Patient Name: Brenda Hanson Unijb'd Date: 04/29/2024 Age:19 hours Reason for consult: Initial assessment;Primapara;1st time breastfeeding;Exclusive pumping and bottle feeding;Early term 37-38.6wks  P1- MOB plans to exclusively pump and bottle feed formula. MOB DOES NOT want to latch infant to breast. RN had already set MOB up with the hospital DEBP. MOB was pumping with the 24 mm flanges. LC switched infant to the 18 mm flanges and confirmed that they were comfortable. MOB reports that she has pumped, but she didn't get anything out, just a little. LC had MOB show LC how much she collected and it was 5 mL! LC praised MOB for her supply. MOB reported that the 5 mL came from just one breast and she threw it away because she thought it wasn't good for infant. LC encouraged Mob to never throw away her breast milk. LC reviewed the difference between colostrum vs mature milk. LC also reviewed the great benefits that colostrum has for infant. LC reviewed CDC milk storage guidelines and encouraged MOB to pump every 3 hrs to promote supply.   Infant has had poor feedings with the bottle since birth, so Lc offered to bottle feed infant. LC reviewed pace feeding and placed infant in an elevated side lying position. LC used the purple slow flow nipple. Infant was uncoordinated, gaggy and spitty. Infant took 5 mL only. LC informed RN. LC reviewed the first 24 hr birthday nap, day 2 cluster feeding, feeding infant on cue 8-12x in 24 hrs, not allowing infant to go over 3 hrs without a feeding, CDC milk storage guidelines, LC services handout and engorgement/breast acre. LC encouraged MOB to call for further assistance as needed.  Maternal Data Has patient been taught Hand Expression?: No Does the patient have breastfeeding experience prior to this delivery?: No  Feeding Mother's Current Feeding Choice: Breast Milk and Formula (exclusive  pumping only) Nipple Type: Extra Slow Flow  Lactation Tools Discussed/Used Tools: Pump;Flanges Flange Size: 18 Breast pump type: Double-Electric Breast Pump;Manual Pump Education: Setup, frequency, and cleaning;Milk Storage Reason for Pumping: exclusive pumping Pumping frequency: 15-20 min every 3 hrs  Interventions Interventions: Breast feeding basics reviewed;Hand pump;DEBP;Education;Pace feeding;LC Services brochure  Discharge Discharge Education: Engorgement and breast care;Warning signs for feeding baby Pump: DEBP;Manual;Hands Free;Personal (all Med[place])  Consult Status Consult Status: Follow-up Date: 04/30/24 Follow-up type: In-patient    Recardo Hoit BS, IBCLC 04/29/2024, 3:32 PM

## 2024-04-30 ENCOUNTER — Encounter: Admitting: Obstetrics and Gynecology

## 2024-04-30 ENCOUNTER — Other Ambulatory Visit (HOSPITAL_COMMUNITY): Payer: Self-pay

## 2024-04-30 MED ORDER — SENNOSIDES-DOCUSATE SODIUM 8.6-50 MG PO TABS
2.0000 | ORAL_TABLET | Freq: Every evening | ORAL | 0 refills | Status: AC | PRN
  Filled 2024-04-30: qty 30, 15d supply, fill #0

## 2024-04-30 MED ORDER — IBUPROFEN 600 MG PO TABS
600.0000 mg | ORAL_TABLET | Freq: Four times a day (QID) | ORAL | 0 refills | Status: AC | PRN
  Filled 2024-04-30: qty 30, 8d supply, fill #0

## 2024-04-30 MED ORDER — ACETAMINOPHEN 500 MG PO TABS
1000.0000 mg | ORAL_TABLET | Freq: Four times a day (QID) | ORAL | 0 refills | Status: AC | PRN
  Filled 2024-04-30: qty 30, 4d supply, fill #0

## 2024-04-30 NOTE — Clinical Social Work Maternal (Signed)
 CLINICAL SOCIAL WORK MATERNAL/CHILD NOTE   Patient Details  Name: Brenda Hanson MRN: [id number] Date of Birth: 12/11/2005   Date:  2024-03-22   Clinical Social Worker Initiating Note:  Eliazar Anais Koenen      Date/Time: Initiated:  04/30/24/1130      Child's Name:  Brenda Hanson May 23, 2025    Biological Parents:  Mother, Father Scottie Hanson 06/27/2004, Fabiene Fetters 06/14/2005)    Need for Interpreter:  None    Reason for Referral:  Behavioral Health Concerns    Address:  [address] [place] [address]    Phone number:  [phone] (home)      Additional phone number:    Household Members/Support Persons (HM/SP):   Household Member/Support Person 1     HM/SP Name Relationship DOB or Age  HM/SP -1 Orest Dolly mother   HM/SP -2     HM/SP -3     HM/SP -4     HM/SP -5     HM/SP -6     HM/SP -7     HM/SP -8         Natural Supports (not living in the home):  Spouse/significant other    Professional Supports: None    Employment: Part-time    Type of Work:      Education:  Engineer, agricultural    Homebound arranged:     Surveyor, Quantity Resources:  Medicaid    Other Resources:  [facility]    Cultural/Religious Considerations Which May Impact Care:     Strengths:  Home prepared for child  , Pediatrician chosen, Compliance with medical plan      Psychotropic Medications:          Pediatrician:    [employer] area   Pediatrician List:    [facility]  [place]   [place]   [place] [facility]       Pediatrician Fax Number:     Risk Factors/Current Problems:  None    Cognitive State:  Able to Concentrate  , Alert      Mood/Affect:  Calm  , Comfortable      CSW Assessment: CSW received a consult for hx f Anxiety and Bipolar. CSW met with MOB to complete assessment and offer support, CSW entered the room and observed, MOB resting in bed and FOB holding the infant at   bedside. CSW introduced self, CSW role and reason for visit. MOB was agreeable to visit and allowed FOB to remain in the room CSW inquire about how MOB was feeling, MOB reported good. CSW confirmed demographic information, MOB verified the information on file was correct. CSW inquired about MOB noted MH hx, MOB denied MH hx or current concerns. CSW assessed for safety, MOB denied any SI or HI. CSW provided education regarding the baby blues period vs. perinatal mood disorders, discussed treatment and gave resources for mental health follow up if concerns arise.  CSW recommends self-evaluation during the postpartum time period using the New Mom Checklist from Postpartum Progress and encouraged MOB to contact a medical professional if symptoms are noted at any time. MOB identified FOB and her mom as her primary supports.    CSW provided review of Sudden Infant Death Syndrome (SIDS) precautions.  MOB reported she has all essential items for the infant including a bassinet, crib and car seat.  CSW identifies no further need for intervention and no barriers to discharge at this time.  CSW Plan/Description:  No Further Intervention Required/No Barriers to Discharge, Sudden Infant Death Syndrome (SIDS) Education, Perinatal Mood and Anxiety Disorder (PMADs) Education, Other Information/Referral to [employer], LCSW 09/26/23, 3:41 PM

## 2024-04-30 NOTE — Plan of Care (Signed)

## 2024-04-30 NOTE — Progress Notes (Signed)
 Subjective:  Brenda Hanson is a 18 y.o. G1P1001 s/p SVD at [redacted]w[redacted]d.  Seen at bedside 2/2 nausea and dizziness while receiving iron  infusion. Patient also reports her fingers feel puffy with knots in her hands. Has not gotten up from her bed. Last ate around 6pm but has been taking in fluids. No vomiting.   Objective: Blood pressure 93/66, pulse 86, temperature 98 F (36.7 C), temperature source Oral, resp. rate 18, height 5' 2 (1.575 m), weight 71.7 kg, last menstrual period 09/02/2023, SpO2 99%, unknown if currently breastfeeding.  Physical Exam:  General: alert, laying in bed Chest: no respiratory distress, breathing comfortably on RA Extremities: minimal edema in b/l hands; radial pulses 2+ Skin: warm, dry  Assessment/Plan: Brenda Hanson is a 18 y.o. G1P1001 s/p SVD at [redacted]w[redacted]d   #Anemia: Nausea, dizziness and edema in fingers while receiving iron  infusion. BP 93/66 though do no believe this is contributing as patient has had soft BPs throughout admission. Possible this may be a reaction. Will give Benadryl  and Zofran . Continue to monitor. Discontinue iron  infusion per patient request.    LOS: 2 days   Antonious Omahoney, DO 04/30/2024, 2:45 AM

## 2024-04-30 NOTE — Discharge Instructions (Signed)

## 2024-04-30 NOTE — Patient Instructions (Signed)

## 2024-04-30 NOTE — Lactation Note (Signed)
 This note was copied from a baby's chart. Lactation Consultation Note  Patient Name: Brenda Hanson Unijb'd Date: 04/30/2024 Age:19 hours , P1  Reason for consult: Follow-up assessment;Primapara;1st time breastfeeding;Early term 37-38.6wks;Infant weight loss;Exclusive pumping and bottle feeding (6 % weight loss,) LC reviewed supply and demand, importance of consistently pumping around the clock ( 8-10 times a day ) both breast for 15 -20 mins, and save the milk for the next feeding. LC reviewed storage of breast milk.  LC reassured mom pumping potentially can be a slow process , but the more consistent the quicker the milk will come in .  LC reviewed engorgement prevention and tx.  See below for details   Maternal Data Does the patient have breastfeeding experience prior to this delivery?: No  Feeding Mother's Current Feeding Choice: Breast Milk and Formula Nipple Type: Extra Slow Flow   Lactation Tools Discussed/Used Tools: Pump;Flanges Flange Size: 18 Breast pump type: Double-Electric Breast Pump;Manual Pump Education: Setup, frequency, and cleaning;Milk Storage Pumped volume:  (per mom has pumped 3-4 times in the last 24 hours)  Interventions Interventions: Breast feeding basics reviewed;Hand pump;DEBP;Education;LC Services brochure;CDC milk storage guidelines;CDC Guidelines for Breast Pump Cleaning  Discharge Discharge Education: Engorgement and breast care;Warning signs for feeding baby;Outpatient recommendation (if needed . LC also recommended and encouraged the breast feeding support group on Tuesdays 10 -12 and she is aware to sign up) Pump: DEBP;Manual;Hands Free;Personal  Consult Status Consult Status: Complete Date: 04/30/24    Brenda Hanson Brenda Hanson 04/30/2024, 12:03 PM

## 2024-04-30 NOTE — Plan of Care (Signed)
 Problem: Education: Goal: Knowledge of General Education information will improve Description: Including pain rating scale, medication(s)/side effects and non-pharmacologic comfort measures 04/30/2024 1022 by Madison Rosina LABOR, LPN Outcome: Adequate for Discharge 04/30/2024 1019 by Madison Rosina LABOR, LPN Outcome: Progressing   Problem: Health Behavior/Discharge Planning: Goal: Ability to manage health-related needs will improve 04/30/2024 1022 by Madison Rosina LABOR, LPN Outcome: Adequate for Discharge 04/30/2024 1019 by Madison Rosina LABOR, LPN Outcome: Progressing   Problem: Clinical Measurements: Goal: Ability to maintain clinical measurements within normal limits will improve 04/30/2024 1022 by Madison Rosina LABOR, LPN Outcome: Adequate for Discharge 04/30/2024 1019 by Madison Rosina LABOR, LPN Outcome: Progressing Goal: Will remain free from infection 04/30/2024 1022 by Madison Rosina LABOR, LPN Outcome: Adequate for Discharge 04/30/2024 1019 by Madison Rosina A, LPN Outcome: Progressing Goal: Diagnostic test results will improve 04/30/2024 1022 by Madison Rosina LABOR, LPN Outcome: Adequate for Discharge 04/30/2024 1019 by Madison Rosina A, LPN Outcome: Progressing Goal: Respiratory complications will improve 04/30/2024 1022 by Madison Rosina LABOR, LPN Outcome: Adequate for Discharge 04/30/2024 1019 by Madison Rosina LABOR, LPN Outcome: Progressing Goal: Cardiovascular complication will be avoided 04/30/2024 1022 by Madison Rosina LABOR, LPN Outcome: Adequate for Discharge 04/30/2024 1019 by Madison Rosina LABOR, LPN Outcome: Progressing   Problem: Activity: Goal: Risk for activity intolerance will decrease 04/30/2024 1022 by Madison Rosina LABOR, LPN Outcome: Adequate for Discharge 04/30/2024 1019 by Madison Rosina A, LPN Outcome: Progressing   Problem: Nutrition: Goal: Adequate nutrition will be maintained 04/30/2024 1022 by Madison Rosina LABOR, LPN Outcome: Adequate for Discharge 04/30/2024 1019 by Madison Rosina LABOR, LPN Outcome: Progressing    Problem: Coping: Goal: Level of anxiety will decrease 04/30/2024 1022 by Madison Rosina LABOR, LPN Outcome: Adequate for Discharge 04/30/2024 1019 by Madison Rosina LABOR, LPN Outcome: Progressing   Problem: Elimination: Goal: Will not experience complications related to bowel motility 04/30/2024 1022 by Madison Rosina LABOR, LPN Outcome: Adequate for Discharge 04/30/2024 1019 by Madison Rosina LABOR, LPN Outcome: Progressing Goal: Will not experience complications related to urinary retention 04/30/2024 1022 by Madison Rosina LABOR, LPN Outcome: Adequate for Discharge 04/30/2024 1019 by Madison Rosina LABOR, LPN Outcome: Progressing   Problem: Pain Managment: Goal: General experience of comfort will improve and/or be controlled 04/30/2024 1022 by Madison Rosina LABOR, LPN Outcome: Adequate for Discharge 04/30/2024 1019 by Madison Rosina LABOR, LPN Outcome: Progressing   Problem: Safety: Goal: Ability to remain free from injury will improve 04/30/2024 1022 by Madison Rosina LABOR, LPN Outcome: Adequate for Discharge 04/30/2024 1019 by Madison Rosina LABOR, LPN Outcome: Progressing   Problem: Skin Integrity: Goal: Risk for impaired skin integrity will decrease 04/30/2024 1022 by Madison Rosina LABOR, LPN Outcome: Adequate for Discharge 04/30/2024 1019 by Madison Rosina LABOR, LPN Outcome: Progressing   Problem: Education: Goal: Knowledge of Childbirth will improve 04/30/2024 1022 by Madison Rosina LABOR, LPN Outcome: Adequate for Discharge 04/30/2024 1019 by Madison Rosina LABOR, LPN Outcome: Progressing Goal: Ability to make informed decisions regarding treatment and plan of care will improve 04/30/2024 1022 by Madison Rosina LABOR, LPN Outcome: Adequate for Discharge 04/30/2024 1019 by Madison Rosina LABOR, LPN Outcome: Progressing Goal: Ability to state and carry out methods to decrease the pain will improve 04/30/2024 1022 by Madison Rosina LABOR, LPN Outcome: Adequate for Discharge 04/30/2024 1019 by Madison Rosina LABOR, LPN Outcome: Progressing Goal: Individualized Educational  Video(s) 04/30/2024 1022 by Madison Rosina LABOR, LPN Outcome: Adequate for Discharge 04/30/2024 1019 by Madison Rosina LABOR, LPN Outcome: Progressing   Problem: Coping: Goal: Ability to verbalize  concerns and feelings about labor and delivery will improve 04/30/2024 1022 by Madison Rosina LABOR, LPN Outcome: Adequate for Discharge 04/30/2024 1019 by Madison Rosina A, LPN Outcome: Progressing   Problem: Life Cycle: Goal: Ability to make normal progression through stages of labor will improve 04/30/2024 1022 by Madison Rosina LABOR, LPN Outcome: Adequate for Discharge 04/30/2024 1019 by Madison Rosina LABOR, LPN Outcome: Progressing Goal: Ability to effectively push during vaginal delivery will improve 04/30/2024 1022 by Madison Rosina LABOR, LPN Outcome: Adequate for Discharge 04/30/2024 1019 by Madison Rosina LABOR, LPN Outcome: Progressing   Problem: Role Relationship: Goal: Will demonstrate positive interactions with the child 04/30/2024 1022 by Madison Rosina LABOR, LPN Outcome: Adequate for Discharge 04/30/2024 1019 by Madison Rosina LABOR, LPN Outcome: Progressing   Problem: Safety: Goal: Risk of complications during labor and delivery will decrease 04/30/2024 1022 by Madison Rosina LABOR, LPN Outcome: Adequate for Discharge 04/30/2024 1019 by Madison Rosina LABOR, LPN Outcome: Progressing   Problem: Pain Management: Goal: Relief or control of pain from uterine contractions will improve 04/30/2024 1022 by Madison Rosina LABOR, LPN Outcome: Adequate for Discharge 04/30/2024 1019 by Madison Rosina LABOR, LPN Outcome: Progressing   Problem: Education: Goal: Knowledge of condition will improve 04/30/2024 1022 by Madison Rosina LABOR, LPN Outcome: Adequate for Discharge 04/30/2024 1019 by Madison Rosina LABOR, LPN Outcome: Progressing Goal: Individualized Educational Video(s) 04/30/2024 1022 by Madison Rosina LABOR, LPN Outcome: Adequate for Discharge 04/30/2024 1019 by Madison Rosina LABOR, LPN Outcome: Progressing Goal: Individualized Newborn Educational Video(s) 04/30/2024  1022 by Madison Rosina LABOR, LPN Outcome: Adequate for Discharge 04/30/2024 1019 by Madison Rosina LABOR, LPN Outcome: Progressing   Problem: Activity: Goal: Will verbalize the importance of balancing activity with adequate rest periods 04/30/2024 1022 by Madison Rosina LABOR, LPN Outcome: Adequate for Discharge 04/30/2024 1019 by Madison Rosina A, LPN Outcome: Progressing Goal: Ability to tolerate increased activity will improve 04/30/2024 1022 by Madison Rosina LABOR, LPN Outcome: Adequate for Discharge 04/30/2024 1019 by Madison Rosina LABOR, LPN Outcome: Progressing   Problem: Coping: Goal: Ability to identify and utilize available resources and services will improve 04/30/2024 1022 by Madison Rosina LABOR, LPN Outcome: Adequate for Discharge 04/30/2024 1019 by Madison Rosina LABOR, LPN Outcome: Progressing   Problem: Life Cycle: Goal: Chance of risk for complications during the postpartum period will decrease 04/30/2024 1022 by Madison Rosina LABOR, LPN Outcome: Adequate for Discharge 04/30/2024 1019 by Madison Rosina LABOR, LPN Outcome: Progressing   Problem: Role Relationship: Goal: Ability to demonstrate positive interaction with newborn will improve 04/30/2024 1022 by Madison Rosina LABOR, LPN Outcome: Adequate for Discharge 04/30/2024 1019 by Madison Rosina LABOR, LPN Outcome: Progressing   Problem: Skin Integrity: Goal: Demonstration of wound healing without infection will improve 04/30/2024 1022 by Madison Rosina LABOR, LPN Outcome: Adequate for Discharge 04/30/2024 1019 by Madison Rosina LABOR, LPN Outcome: Progressing

## 2024-04-30 NOTE — Progress Notes (Addendum)
 POSTPARTUM PROGRESS NOTE Post Partum Day 2  Subjective: Brenda Hanson is a 18 y.o. G1P1001 s/p SVD at [redacted]w[redacted]d. Episode of nausea, dizziness and edema overnight while receiving iron  infusion which seems to have improved after benadryl  and zofran . She did have 1 episode of emesis. States she is feeling better this morning. Pt denies problems with ambulating, voiding or po intake.  She denies nausea or vomiting.  Pain is well controlled.  She has had flatus. She has not had bowel movement.  Lochia Minimal.   Objective: Blood pressure 101/64, pulse 68, temperature 98.6 F (37 C), temperature source Oral, resp. rate 16, height 5' 2 (1.575 m), weight 71.7 kg, last menstrual period 09/02/2023, SpO2 99%, unknown if currently breastfeeding.  Physical Exam:  General: alert, cooperative and no distress Chest: no respiratory distress Heart:regular rate, distal pulses intact Abdomen: soft, nontender,  Uterine Fundus: firm, appropriately tender DVT Evaluation: No calf swelling or tenderness Extremities: no edema Skin: warm, dry  Recent Labs    04/28/24 1118 04/29/24 0509  HGB 9.0* 7.3*  HCT 27.7* 22.5*    Assessment/Plan: Brenda Hanson is a 18 y.o. G1P1001 s/p SVD at [redacted]w[redacted]d   PPD#2 - Doing well Hgb: s/p Venofer  PPD1 Contraception: probably Nexplanon, TBD at [facility] appt Feeding: both  Dispo: Plan for discharge today.  LOS: 2 days   Bhagat, Virali, DO 04/30/2024, 7:20 AM    Attestation of Supervision of Resident: Evaluation and management procedures were performed by the learners: Family Medicine Resident under my supervision. I was immediately available for direct supervision, assistance and direction throughout this encounter.  I also confirm that I have verified the information documented in the resident's note, and that I have also personally reperformed the pertinent components of the physical exam and all of the medical decision making activities.  I have also made any necessary editorial  changes.  Barabara Maier, DO [facility] [employer]

## 2024-05-08 ENCOUNTER — Telehealth (HOSPITAL_COMMUNITY): Payer: Self-pay

## 2024-05-08 NOTE — Telephone Encounter (Signed)
 05/08/2024 9070  Name: Brenda Hanson MRN: [id number] DOB: 23-Aug-2005  Reason for Call:  Transition of Care Hospital Discharge Call  Contact Status: Patient Contact Status: Unable to contact (No answer, mailbox is full)  Language assistant needed:          Follow-Up Questions:    Van Postnatal Depression Scale:  In the Past 7 Days:    PHQ2-9 Depression Scale:     Discharge Follow-up:    Post-discharge interventions: NA  Signature  Rosaline Deretha PEAK

## 2024-06-11 ENCOUNTER — Ambulatory Visit: Admitting: Obstetrics and Gynecology

## 2024-06-12 ENCOUNTER — Encounter: Payer: Self-pay | Admitting: Advanced Practice Midwife

## 2024-06-12 ENCOUNTER — Ambulatory Visit: Admitting: Advanced Practice Midwife

## 2024-06-12 VITALS — BP 111/74 | HR 76 | Wt 138.8 lb

## 2024-06-12 DIAGNOSIS — Z8619 Personal history of other infectious and parasitic diseases: Secondary | ICD-10-CM | POA: Diagnosis not present

## 2024-06-12 DIAGNOSIS — Z3009 Encounter for other general counseling and advice on contraception: Secondary | ICD-10-CM | POA: Diagnosis not present

## 2024-06-12 MED ORDER — MEDROXYPROGESTERONE ACETATE 150 MG/ML IM SUSP: 150.0000 mg | INTRAMUSCULAR | 3 refills | Status: AC

## 2024-06-12 MED ORDER — MEDROXYPROGESTERONE ACETATE 150 MG/ML IM SUSP
150.0000 mg | Freq: Once | INTRAMUSCULAR | Status: AC
  Administered 2024-06-12: 150 mg via INTRAMUSCULAR

## 2024-06-12 NOTE — Progress Notes (Unsigned)
 PP; pt wishes to discuss birth control, leaning towards depo.  Pt has no other concerns at this time.

## 2024-06-12 NOTE — Progress Notes (Unsigned)
 "   Post Partum Visit Note  Brenda Hanson is a 19 y.o. G29P1001 female who presents for a postpartum visit. She is 6 weeks postpartum following a normal spontaneous vaginal delivery.  I have fully reviewed the prenatal and intrapartum course. The delivery was at [redacted]w[redacted]d gestational weeks.  Anesthesia: epidural. Postpartum course has been good. Baby is doing well yes. Baby is feeding by bottle - Enfamil AR. Bleeding no bleeding. Bowel function is normal. Bladder function is normal. Patient is not sexually active. Contraception method is abstinence. Postpartum depression screening: negative.   The pregnancy intention screening data noted above was reviewed. Potential methods of contraception were discussed. The patient elected to proceed with No data recorded.   Edinburgh Postnatal Depression Scale - 06/12/24 0948       Edinburgh Postnatal Depression Scale:  In the Past 7 Days   I have been able to laugh and see the funny side of things. 0    I have looked forward with enjoyment to things. 0    I have blamed myself unnecessarily when things went wrong. 0    I have been anxious or worried for no good reason. 0    I have felt scared or panicky for no good reason. 0    Things have been getting on top of me. 0    I have been so unhappy that I have had difficulty sleeping. 0    I have felt sad or miserable. 0    I have been so unhappy that I have been crying. 0    The thought of harming myself has occurred to me. 0    Edinburgh Postnatal Depression Scale Total 0          Health Maintenance Due  Topic Date Due   Meningococcal B Vaccine (1 of 2 - Standard) Never done   Influenza Vaccine  12/23/2023   COVID-19 Vaccine (1 - 2025-26 season) Never done    The following portions of the patient's history were reviewed and updated as appropriate: allergies, current medications, past family history, past medical history, past social history, past surgical history, and problem list.  Review of  Systems Constitutional: negative Respiratory: negative Cardiovascular: negative Gastrointestinal: negative Genitourinary:negative Integument/breast: negative Musculoskeletal:negative Neurological: negative Behavioral/Psych: negative  Objective:  BP 111/74   Pulse 76   Wt 138 lb 12.8 oz (63 kg)   LMP 09/02/2023 (Approximate)   Breastfeeding No   BMI 25.39 kg/m    General:  alert and cooperative   Breasts:  normal  Lungs: clear to auscultation bilaterally  Heart:  regular rate and rhythm, S1, S2 normal, no murmur, click, rub or gallop  Abdomen: soft, non-tender; bowel sounds normal; no masses,  no organomegaly   Wound well approximated incision  GU exam:  not indicated       Assessment:  1. Postpartum examination following vaginal delivery - See plan below.  2. Encounter for counseling regarding contraception (Primary) - Plan to get in office Depo provera  shoot injection for contraceptive.    postpartum exam.   Plan:   Essential components of care per ACOG recommendations:  1.  Mood and well being: Patient with positive depression screening today. Reviewed local resources for support.  - Patient tobacco use? No.   - hx of drug use? No.    2. Infant care and feeding:  -Patient currently breastmilk feeding? Yes. Discussed returning to work and pumping.  -Social determinants of health (SDOH) reviewed in EPIC. No concerns The following needs were  identified  3. Sexuality, contraception and birth spacing - Patient does not want a pregnancy in the next year.  Desired family size is 1 children.  - Reviewed reproductive life planning. Reviewed contraceptive methods based on pt preferences and effectiveness.  Patient desired Depo- Provera  injection today.   - Discussed birth spacing of 18 months  4. Sleep and fatigue -Encouraged family/partner/community support of 4 hrs of uninterrupted sleep to help with mood and fatigue  5. Physical Recovery  - Discussed patients  delivery and complications. She describes her labor as good. - Patient had a Vaginal, no problems at delivery. Patient had a 2nd degree laceration. Perineal healing reviewed. Patient expressed understanding - Patient has urinary incontinence? No. - Patient is safe to resume physical and sexual activity  6.  Health Maintenance - HM due items addressed Yes - Last pap smear n/a due to age.   Pap smear not done at today's visit.  -Breast Cancer screening indicated? No.   7. Chronic Disease/Pregnancy Condition follow up: None  - PCP follow up  Midwife Attestation:  I personally saw and evaluated the patient, performing the key elements of the service. I developed and verified the management plan that is described in the resident's/student's note, and I agree with the content with my edits above. VSS, HRR&R, Resp unlabored, Legs neg.    Olam Boards, CNM 10:01 AM    "

## 2024-06-13 DIAGNOSIS — Z8619 Personal history of other infectious and parasitic diseases: Secondary | ICD-10-CM | POA: Insufficient documentation

## 2024-09-10 ENCOUNTER — Ambulatory Visit: Payer: Self-pay
